# Patient Record
Sex: Female | Born: 1985 | Race: Black or African American | Hispanic: No | Marital: Single | Smoking: Never smoker
Health system: Southern US, Community
[De-identification: ages and names within clinical notes are randomized; demographics above are authoritative.]

## PROBLEM LIST (undated history)

## (undated) ENCOUNTER — Inpatient Hospital Stay (HOSPITAL_COMMUNITY): Payer: Self-pay

## (undated) DIAGNOSIS — O99345 Other mental disorders complicating the puerperium: Secondary | ICD-10-CM

## (undated) DIAGNOSIS — F53 Postpartum depression: Secondary | ICD-10-CM

## (undated) DIAGNOSIS — A749 Chlamydial infection, unspecified: Secondary | ICD-10-CM

## (undated) DIAGNOSIS — N739 Female pelvic inflammatory disease, unspecified: Secondary | ICD-10-CM

## (undated) HISTORY — PX: INDUCED ABORTION: SHX677

---
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
## 2007-04-04 ENCOUNTER — Inpatient Hospital Stay (HOSPITAL_COMMUNITY): Admission: AD | Admit: 2007-04-04 | Discharge: 2007-04-04 | Payer: Self-pay | Admitting: Obstetrics

## 2007-04-08 ENCOUNTER — Inpatient Hospital Stay (HOSPITAL_COMMUNITY): Admission: AD | Admit: 2007-04-08 | Discharge: 2007-04-08 | Payer: Self-pay | Admitting: Obstetrics & Gynecology

## 2007-04-14 ENCOUNTER — Inpatient Hospital Stay (HOSPITAL_COMMUNITY): Admission: AD | Admit: 2007-04-14 | Discharge: 2007-04-14 | Payer: Self-pay | Admitting: Obstetrics

## 2007-04-17 ENCOUNTER — Ambulatory Visit (HOSPITAL_COMMUNITY): Admission: RE | Admit: 2007-04-17 | Discharge: 2007-04-17 | Payer: Self-pay | Admitting: Obstetrics & Gynecology

## 2007-06-30 ENCOUNTER — Inpatient Hospital Stay (HOSPITAL_COMMUNITY): Admission: AD | Admit: 2007-06-30 | Discharge: 2007-06-30 | Payer: Self-pay | Admitting: Obstetrics & Gynecology

## 2007-08-02 ENCOUNTER — Ambulatory Visit: Admission: AD | Admit: 2007-08-02 | Discharge: 2007-08-02 | Payer: Self-pay | Admitting: Obstetrics & Gynecology

## 2007-08-03 ENCOUNTER — Inpatient Hospital Stay (HOSPITAL_COMMUNITY): Admission: AD | Admit: 2007-08-03 | Discharge: 2007-08-05 | Payer: Self-pay | Admitting: Obstetrics & Gynecology

## 2007-08-09 ENCOUNTER — Inpatient Hospital Stay (HOSPITAL_COMMUNITY): Admission: AD | Admit: 2007-08-09 | Discharge: 2007-08-09 | Payer: Self-pay | Admitting: Obstetrics

## 2008-01-02 ENCOUNTER — Inpatient Hospital Stay (HOSPITAL_COMMUNITY): Admission: AD | Admit: 2008-01-02 | Discharge: 2008-01-02 | Payer: Self-pay | Admitting: Obstetrics & Gynecology

## 2008-01-31 ENCOUNTER — Ambulatory Visit (HOSPITAL_COMMUNITY): Admission: RE | Admit: 2008-01-31 | Discharge: 2008-01-31 | Payer: Self-pay | Admitting: Obstetrics & Gynecology

## 2008-05-13 ENCOUNTER — Inpatient Hospital Stay (HOSPITAL_COMMUNITY): Admission: AD | Admit: 2008-05-13 | Discharge: 2008-05-13 | Payer: Self-pay | Admitting: Obstetrics

## 2008-05-31 ENCOUNTER — Inpatient Hospital Stay (HOSPITAL_COMMUNITY): Admission: AD | Admit: 2008-05-31 | Discharge: 2008-05-31 | Payer: Self-pay | Admitting: Obstetrics & Gynecology

## 2008-06-06 ENCOUNTER — Inpatient Hospital Stay (HOSPITAL_COMMUNITY): Admission: AD | Admit: 2008-06-06 | Discharge: 2008-06-06 | Payer: Self-pay | Admitting: Obstetrics

## 2008-06-08 ENCOUNTER — Inpatient Hospital Stay (HOSPITAL_COMMUNITY): Admission: AD | Admit: 2008-06-08 | Discharge: 2008-06-10 | Payer: Self-pay | Admitting: Obstetrics & Gynecology

## 2008-09-24 IMAGING — CT CT ANGIO CHEST
3 of 4 series · 19 of 31 positions shown · IV contrast (150ml omni/300%)
Comparison: none

CLINICAL DATA: 6 days post-partum from vaginal delivery.  Chest pain and shortness of breath.  Clinical suspicion for pulmonary embolism.      
CT ANGIOGRAPHY OF CHEST:
TECHNIQUE: Multidetector CT imaging of the chest was performed during bolus injection of intravenous contrast.  Multiplanar CT angiographic image reconstructions were generated to evaluate the vascular anatomy.
Contrast:  150 cc Omnipaque 300

[Series 4: recon 3: pe chest · axial · 0.66mm/px · z∈[-311,-106]mm · 12 of 251 slices shown]
[im 23/251  lung]
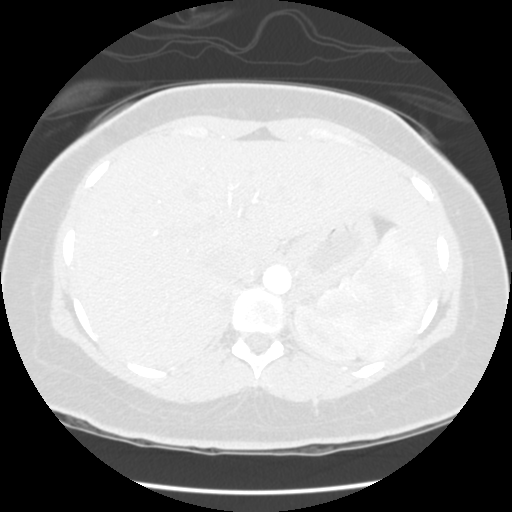
[im 46/251  mediastinal]
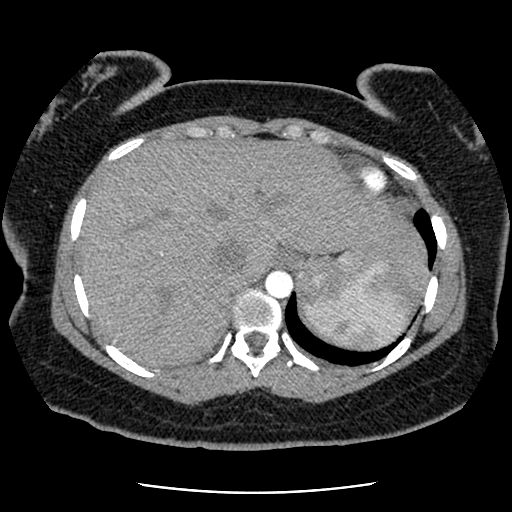
[im 69/251  lung]
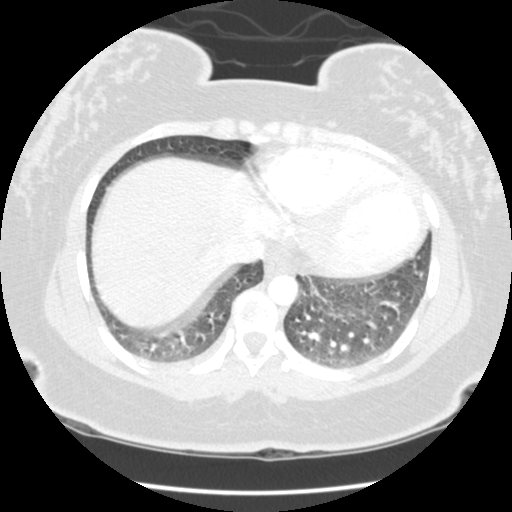
[im 91/251  mediastinal]
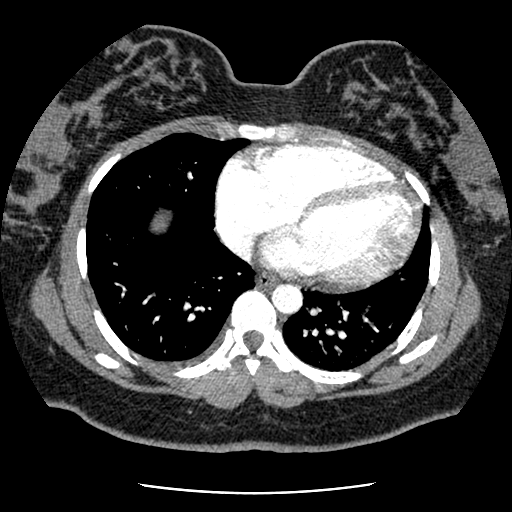
[im 114/251  lung]
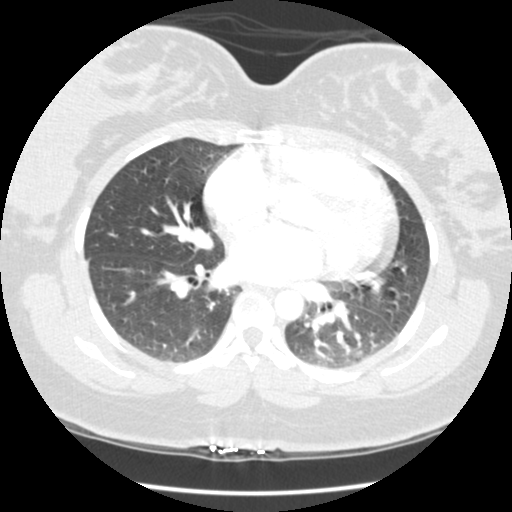
[im 126/251  mediastinal]
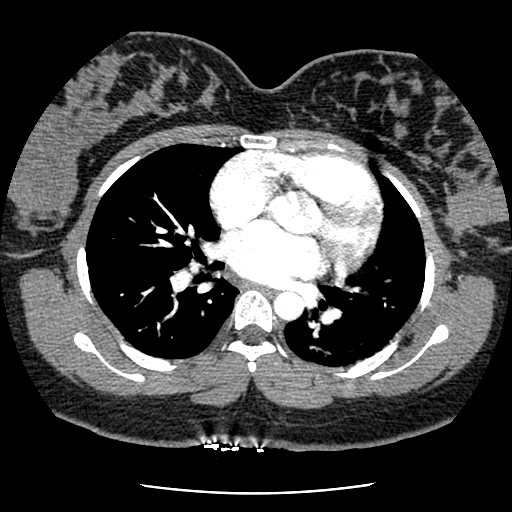
[im 137/251  lung]
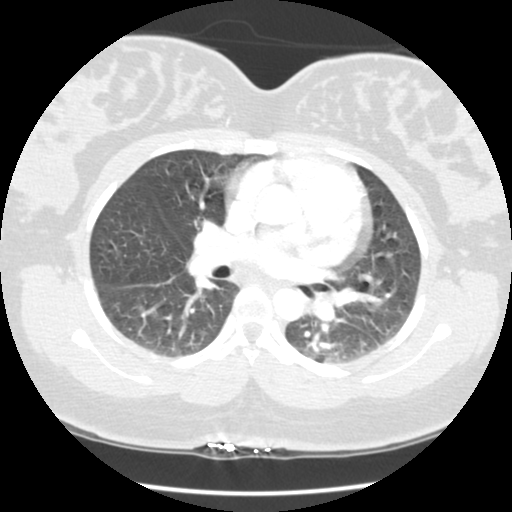
[im 153/251  mediastinal]
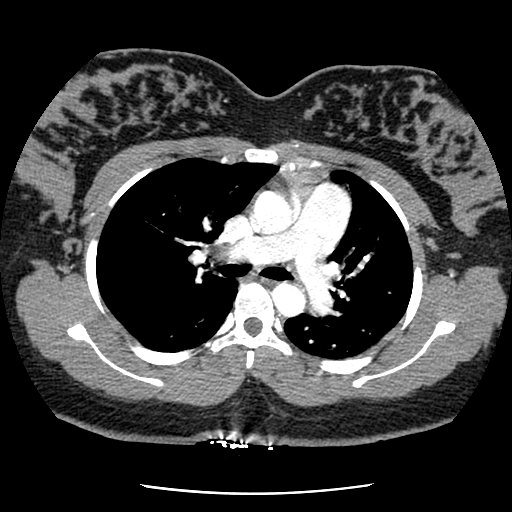
[im 160/251  lung]
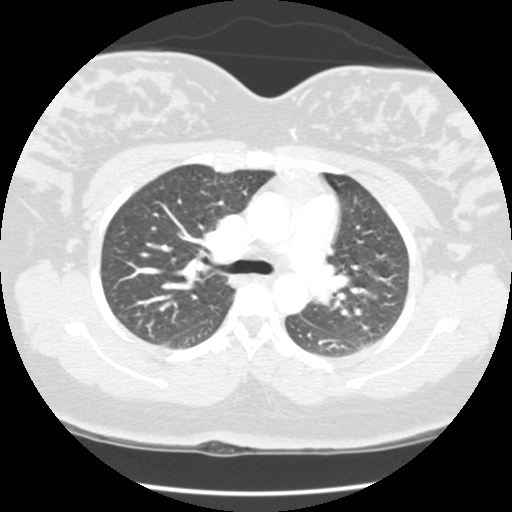
[im 182/251  mediastinal]
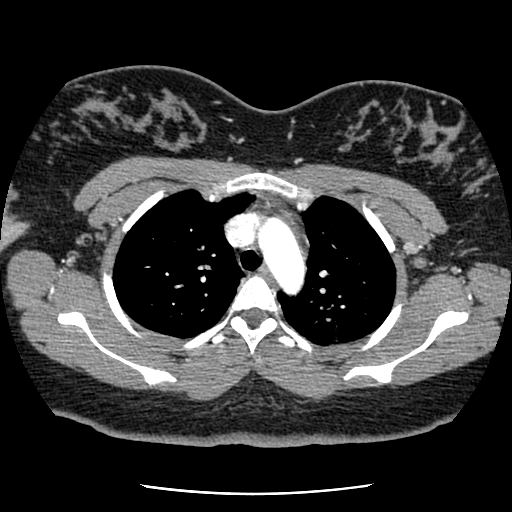
[im 205/251  lung]
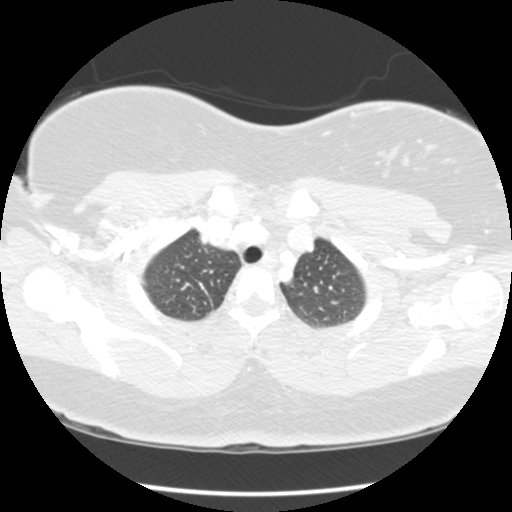
[im 228/251  mediastinal]
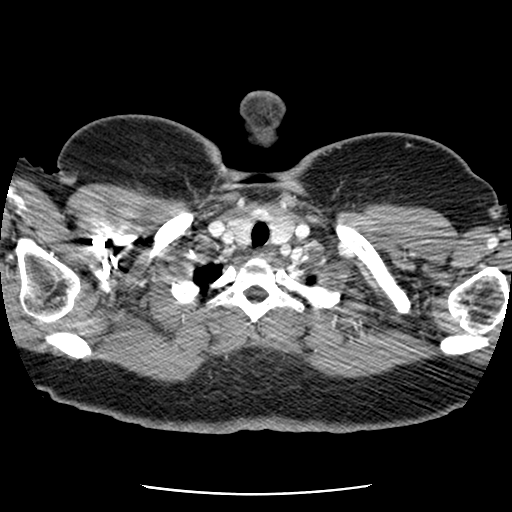

[Series 400: reformatted · coronal · 0.66mm/px · 3 of 96 slices shown (1 of 2)]
[im 24/96  lung]
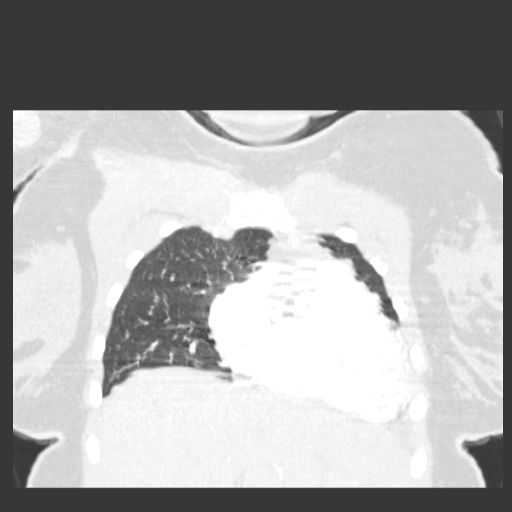
[im 48/96  lung]
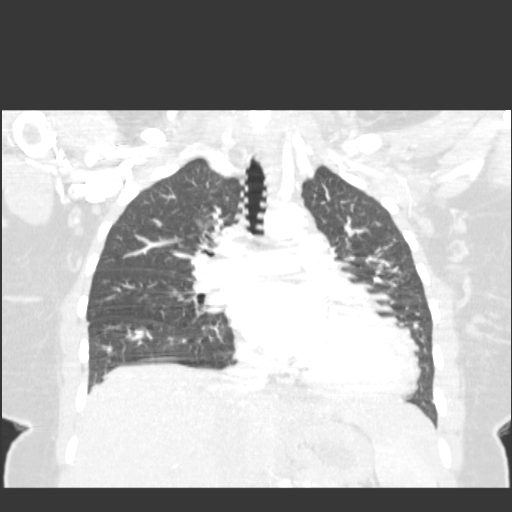
[im 72/96  lung]
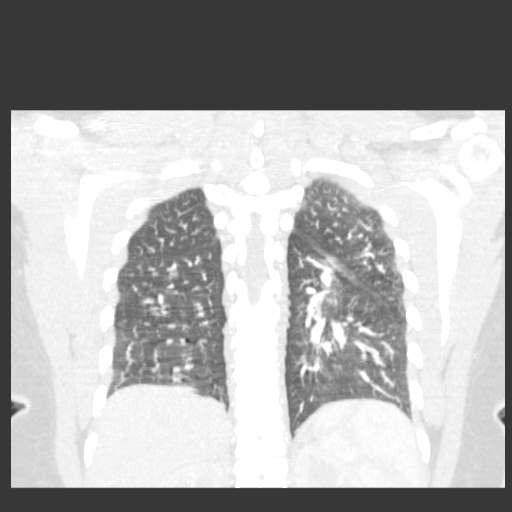

[Series 401: reformatted · sagittal · 0.66mm/px · 4 of 122 slices shown (2 of 2)]
[im 25/122  lung]
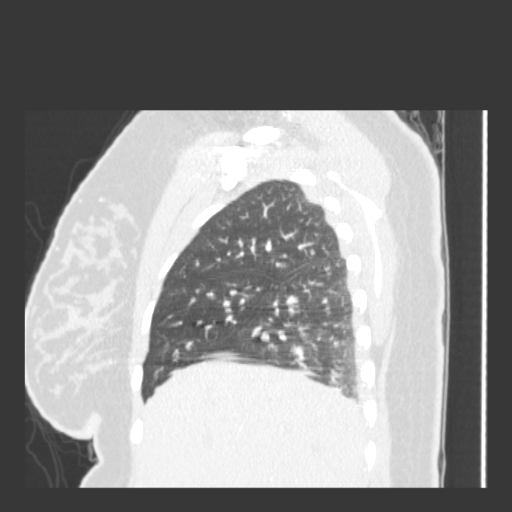
[im 49/122  lung]
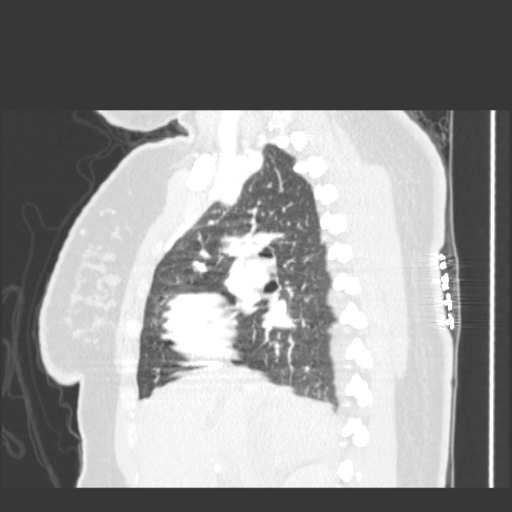
[im 73/122  lung]
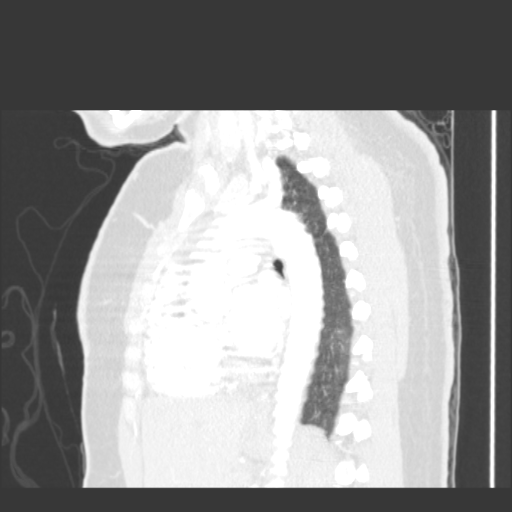
[im 97/122  lung]
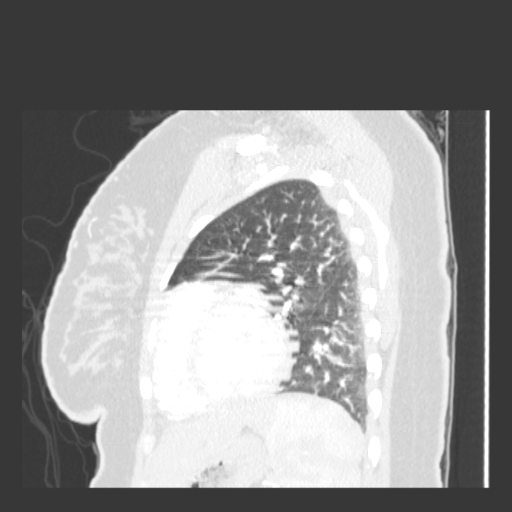

[19 of 31 positions shown; findings below may reference images not displayed]

FINDINGS: Satisfactory opacification of the pulmonary arteries is seen, and there is no evidence of pulmonary embolism.  There is no evidence of thoracic aortic aneurysm or dissection.  There is no evidence of hilar or mediastinal masses.  
There is no evidence of pulmonary consolidation or mass.  Mild thickening of pulmonary interlobular septa is seen mainly in the lung bases which is suspicious for mild interstitial edema.
IMPRESSION: 1.  No evidence of pulmonary embolism. 
2.  Mild interstitial prominence in the lung bases, suspicious for mild interstitial edema.

## 2009-03-18 ENCOUNTER — Emergency Department (HOSPITAL_COMMUNITY): Admission: EM | Admit: 2009-03-18 | Discharge: 2009-03-18 | Payer: Self-pay | Admitting: Emergency Medicine

## 2009-09-30 ENCOUNTER — Inpatient Hospital Stay (HOSPITAL_COMMUNITY): Admission: AD | Admit: 2009-09-30 | Discharge: 2009-09-30 | Payer: Self-pay | Admitting: Obstetrics & Gynecology

## 2009-10-25 ENCOUNTER — Emergency Department (HOSPITAL_COMMUNITY): Admission: EM | Admit: 2009-10-25 | Discharge: 2009-10-25 | Payer: Self-pay | Admitting: Emergency Medicine

## 2010-02-05 ENCOUNTER — Inpatient Hospital Stay (HOSPITAL_COMMUNITY): Admission: AD | Admit: 2010-02-05 | Discharge: 2010-02-05 | Payer: Self-pay | Admitting: Obstetrics and Gynecology

## 2010-03-21 ENCOUNTER — Inpatient Hospital Stay (HOSPITAL_COMMUNITY): Admission: AD | Admit: 2010-03-21 | Discharge: 2010-03-21 | Payer: Self-pay | Admitting: Obstetrics and Gynecology

## 2010-03-22 ENCOUNTER — Ambulatory Visit (HOSPITAL_COMMUNITY): Admission: RE | Admit: 2010-03-22 | Discharge: 2010-03-22 | Payer: Self-pay | Admitting: Obstetrics and Gynecology

## 2010-03-23 ENCOUNTER — Inpatient Hospital Stay (HOSPITAL_COMMUNITY): Admission: AD | Admit: 2010-03-23 | Discharge: 2010-03-23 | Payer: Self-pay | Admitting: Obstetrics and Gynecology

## 2010-04-06 ENCOUNTER — Inpatient Hospital Stay (HOSPITAL_COMMUNITY): Admission: AD | Admit: 2010-04-06 | Discharge: 2010-04-08 | Payer: Self-pay | Admitting: Obstetrics and Gynecology

## 2010-11-08 ENCOUNTER — Encounter: Payer: Self-pay | Admitting: Obstetrics & Gynecology

## 2010-11-18 ENCOUNTER — Other Ambulatory Visit: Payer: Self-pay | Admitting: Family Medicine

## 2010-11-18 ENCOUNTER — Inpatient Hospital Stay (INDEPENDENT_AMBULATORY_CARE_PROVIDER_SITE_OTHER)
Admission: RE | Admit: 2010-11-18 | Discharge: 2010-11-18 | Disposition: A | Discharge status: Home / Self Care | Payer: Self-pay | Source: Ambulatory Visit | Attending: Family Medicine | Admitting: Family Medicine

## 2010-11-18 ENCOUNTER — Other Ambulatory Visit: Payer: Self-pay

## 2010-11-18 DIAGNOSIS — N760 Acute vaginitis: Secondary | ICD-10-CM

## 2010-11-18 LAB — WET PREP, GENITAL: Yeast Wet Prep HPF POC: NONE SEEN

## 2010-11-19 LAB — POCT URINALYSIS DIPSTICK
Nitrite: NEGATIVE
Urine Glucose, Fasting: NEGATIVE mg/dL
Urobilinogen, UA: 0.2 mg/dL (ref 0.0–1.0)

## 2010-11-19 LAB — GC/CHLAMYDIA PROBE AMP, GENITAL: Chlamydia, DNA Probe: NEGATIVE

## 2010-11-19 LAB — POCT PREGNANCY, URINE: Preg Test, Ur: NEGATIVE

## 2011-01-03 LAB — CBC
HCT: 36.6 % (ref 36.0–46.0)
Hemoglobin: 11.9 g/dL — ABNORMAL LOW (ref 12.0–15.0)
Hemoglobin: 12.5 g/dL (ref 12.0–15.0)
MCV: 92.1 fL (ref 78.0–100.0)
Platelets: 141 10*3/uL — ABNORMAL LOW (ref 150–400)
RBC: 3.68 MIL/uL — ABNORMAL LOW (ref 3.87–5.11)
RDW: 13.4 % (ref 11.5–15.5)
RDW: 13.7 % (ref 11.5–15.5)
WBC: 9.1 10*3/uL (ref 4.0–10.5)

## 2011-01-04 LAB — URINALYSIS, ROUTINE W REFLEX MICROSCOPIC
Leukocytes, UA: NEGATIVE
Nitrite: NEGATIVE
Protein, ur: 30 mg/dL — AB

## 2011-01-04 LAB — URINE MICROSCOPIC-ADD ON

## 2011-01-05 LAB — URINALYSIS, ROUTINE W REFLEX MICROSCOPIC
Bilirubin Urine: NEGATIVE
Glucose, UA: NEGATIVE mg/dL
Hgb urine dipstick: NEGATIVE
Nitrite: NEGATIVE
Specific Gravity, Urine: 1.01 (ref 1.005–1.030)
Urobilinogen, UA: 0.2 mg/dL (ref 0.0–1.0)
pH: 6.5 (ref 5.0–8.0)

## 2011-01-19 LAB — URINE MICROSCOPIC-ADD ON

## 2011-01-19 LAB — URINALYSIS, ROUTINE W REFLEX MICROSCOPIC
Glucose, UA: NEGATIVE mg/dL
Nitrite: NEGATIVE

## 2011-03-02 NOTE — H&P (Signed)
NAMEROSALY, LABARBERA              ACCOUNT NO.:  [id number]   MEDICAL RECORD NO.:  [id number]          PATIENT TYPE:  INP   LOCATION:  9145                          FACILITY:  [facility]   PHYSICIAN:  Roseanna Rainbow, M.D.DATE OF BIRTH:  01/25/1986   DATE OF ADMISSION:  08/03/2007  DATE OF DISCHARGE:                              HISTORY & PHYSICAL   CHIEF COMPLAINT:  The patient is a 25 year old gravida 1, para 0, with  an estimated date of confinement of August 21, 2007, with an  intrauterine pregnancy at 37+ weeks complaining of ruptured membranes.   HISTORY OF PRESENT ILLNESS:  The patient reports leaking fluid possibly  since yesterday.  On exam in the office, there was copious thin mucus  with Nitrazine and fern negative.  Subsequent AFI was 8, and the patient  was for repeat AFI today.  She presented to the maternity admission's  unit with gross rupture fern and Nitrazine positive.   ALLERGIES:  No known drug allergies.   MEDICATIONS:  Prenatal vitamins.   OBSTETRIC RISK FACTORS:  Adolescent.   PRENATAL SCREENINGS:  Urine culture and sensitivity:  No growth.  One-  hour GCT:  56.  Hepatitis B surface antigen:  Negative.  Hematocrit  36.1, hemoglobin 11.8.  HIV:  Nonreactive.  Platelet count 193,000,  blood type B positive, antibody screen negative, RPR nonreactive,  rubella immune, sickle cell negative, varicella immune.   PAST GYNECOLOGIC HISTORY:  There was a history of gonorrhea.   PAST MEDICAL HISTORY:  She denies.   PAST SURGICAL HISTORY:  No previous surgeries.   SOCIAL HISTORY:  She is a Consulting civil engineer, does not give any significant history  of alcohol usage, has no significant smoking history, denies illicit  drug use.   FAMILY HISTORY:  No major illnesses known.   PHYSICAL EXAMINATION:  VITAL SIGNS:  Stable, afebrile, fetal heart  tracing reassuring.  Tocodynamometer:  Irregular uterine contractions.  PELVIC:  Sterile vaginal exam per the RN:  The cervix is 3 cm  dilated,  50% effaced.  Again, there was pooling of fluid that was fern and  Nitrazine positive.   ASSESSMENT:  Primigravida at term, early labor, questionable prolonged  rupture of membranes.  Group B strep culture is pending.  Fetal heart  tracing consistent with fetal well being.   PLAN:  Admission: likely augmentation of labor: low-dose Pitocin per  protocol: penicillin GBS prophylaxis for risk factors.      Roseanna Rainbow, M.D.  Electronically Signed     LAJ/MEDQ  D:  08/03/2007  T:  08/04/2007  Job:  [id number]

## 2011-07-16 LAB — URINALYSIS, ROUTINE W REFLEX MICROSCOPIC
Hgb urine dipstick: NEGATIVE
Protein, ur: NEGATIVE
pH: 6.5

## 2011-07-28 LAB — CBC
Hemoglobin: 13.1
MCV: 90.1
MCV: 90.5
RBC: 3.71 — ABNORMAL LOW
RBC: 4.17
RDW: 12.4
RDW: 12.7
WBC: 8.4

## 2011-07-28 LAB — RPR: RPR Ser Ql: NONREACTIVE

## 2011-07-30 LAB — URINALYSIS, ROUTINE W REFLEX MICROSCOPIC
Bilirubin Urine: NEGATIVE
Glucose, UA: NEGATIVE
Protein, ur: NEGATIVE
pH: 6.5

## 2011-08-04 LAB — URINALYSIS, ROUTINE W REFLEX MICROSCOPIC
Bilirubin Urine: NEGATIVE
Hgb urine dipstick: NEGATIVE
Nitrite: NEGATIVE
Protein, ur: NEGATIVE
Specific Gravity, Urine: 1.025
pH: 6.5

## 2011-08-04 LAB — URINE MICROSCOPIC-ADD ON

## 2012-09-28 ENCOUNTER — Encounter (HOSPITAL_COMMUNITY): Payer: Self-pay

## 2012-09-28 ENCOUNTER — Emergency Department (HOSPITAL_COMMUNITY)
Admission: EM | Admit: 2012-09-28 | Discharge: 2012-09-28 | Disposition: A | Discharge status: Home / Self Care | Payer: Self-pay | Attending: Emergency Medicine | Admitting: Emergency Medicine

## 2012-09-28 DIAGNOSIS — B0089 Other herpesviral infection: Secondary | ICD-10-CM | POA: Insufficient documentation

## 2012-09-28 MED ORDER — ACYCLOVIR 400 MG PO TABS: 400.0000 mg | ORAL_TABLET | Freq: Three times a day (TID) | ORAL | Status: DC

## 2012-09-28 NOTE — ED Provider Notes (Signed)
History     CSN: [id number]  Arrival date & time 09/28/12  0703   First MD Initiated Contact with Patient 09/28/12 0725      Chief Complaint  Patient presents with  . Rash     HPI Pt was seen at 0730.   Per pt, c/o gradual onset and persistence of intermittent blistering rash to her left 5th finger for the past 2 months.  Pt states the blisters "break" then "go away and come back."  Denies fevers, no open wounds, no injury, no focal motor weakness, no tingling/numbness in extremity.    History reviewed. No pertinent past medical history.  History reviewed. No pertinent past surgical history.   History  Substance Use Topics  . Smoking status: Never Smoker   . Smokeless tobacco: Not on file  . Alcohol Use: No      Review of Systems ROS: Statement: All systems negative except as marked or noted in the HPI; Constitutional: Negative for fever and chills. ; ; Eyes: Negative for eye pain, redness and discharge. ; ; ENMT: Negative for ear pain, hoarseness, nasal congestion, sinus pressure and sore throat. ; ; Cardiovascular: Negative for chest pain, palpitations, diaphoresis, dyspnea and peripheral edema. ; ; Respiratory: Negative for cough, wheezing and stridor. ; ; Gastrointestinal: Negative for nausea, vomiting, diarrhea, abdominal pain, blood in stool, hematemesis, jaundice and rectal bleeding. . ; ; Genitourinary: Negative for dysuria, flank pain and hematuria. ; ; Musculoskeletal: Negative for back pain and neck pain. Negative for swelling and trauma.; ; Skin: +blisters, rash. Negative for pruritus, abrasions, bruising and skin lesion.; ; Neuro: Negative for headache, lightheadedness and neck stiffness. Negative for weakness, altered level of consciousness , altered mental status, extremity weakness, paresthesias, involuntary movement, seizure and syncope.       Allergies  Review of patient's allergies indicates no known allergies.  Home Medications     BP 125/72  Temp  98.1 F (36.7 C) (Oral)  Resp 18  SpO2 100%  LMP 09/18/2012  Physical Exam 0735: Physical examination:  Nursing notes reviewed; Vital signs and O2 SAT reviewed;  Constitutional: Well developed, Well nourished, Well hydrated, In no acute distress; Head:  Normocephalic, atraumatic; Eyes: EOMI, PERRL, No scleral icterus; ENMT: Mouth and pharynx normal, Mucous membranes moist; Neck: Supple, Full range of motion, No lymphadenopathy; Cardiovascular: Regular rate and rhythm; Respiratory: Breath sounds clear.  Speaking full sentences with ease, Normal respiratory effort/excursion; Chest: No deformity, Movement normal;; Extremities: Pulses normal, No deformity. +small grouped vesicles on erythematous base to left dorsal 5th finger.  No open wounds, no ecchymosis. No drainage. No edema.; Neuro: AA&Ox3, Major CN grossly intact.  Speech clear. No gross focal motor or sensory deficits in extremities.; Skin: Color normal, Warm, Dry.   ED Course  Procedures     MDM  MDM Reviewed: nursing note and vitals     0740:  Rash has been present on and off for the past 2 months, starts as "itching" then small clusters of blisters appear.  Will tx for presumed herpetic whitlow.  Dx d/w pt.  Questions answered.  Verb understanding, agreeable to d/c home with outpt f/u with Derm MD.         Laray Anger, DO 09/29/12 1322

## 2012-09-28 NOTE — ED Notes (Signed)
Pt with blistering rash on left pinky finger

## 2012-10-18 NOTE — L&D Delivery Note (Signed)
Delivery Note At 4:56 PM a viable female was delivered via Vaginal, Spontaneous Delivery (Presentation: Left Occiput Anterior).  APGAR: 9, 9; weight TBD.   Placenta status: Intact, Spontaneous.  Cord: 3 vessels with the following complications: None.  Anesthesia: Epidural  Episiotomy: None Lacerations: None Suture Repair: n/a Est. Blood Loss (mL):  Mom to postpartum.  Baby to nursery-stable.  Baby had a double nuchal cord around her neck, which was reduced by Dr. Reola Calkins successfully prior to delivery.  Jacquelin Hawking, MD 06/11/2013, 6:16 PM  I was present for the entire delivery of this liveborn female by Dr. Caleb Popp. I agree with his documentation as above. Double nuchal that was reduced on the perineum. No tears or lacerations. Minimal blood loss.   Diora Bellizzi, Redmond Baseman, MD

## 2012-11-13 ENCOUNTER — Inpatient Hospital Stay (HOSPITAL_COMMUNITY)
Admission: AD | Admit: 2012-11-13 | Discharge: 2012-11-13 | Disposition: A | Discharge status: Home / Self Care | Payer: Self-pay | Source: Ambulatory Visit | Attending: Obstetrics & Gynecology | Admitting: Obstetrics & Gynecology

## 2012-11-13 ENCOUNTER — Encounter (HOSPITAL_COMMUNITY): Payer: Self-pay

## 2012-11-13 DIAGNOSIS — O219 Vomiting of pregnancy, unspecified: Secondary | ICD-10-CM

## 2012-11-13 DIAGNOSIS — O210 Mild hyperemesis gravidarum: Secondary | ICD-10-CM | POA: Insufficient documentation

## 2012-11-13 LAB — URINE MICROSCOPIC-ADD ON

## 2012-11-13 LAB — POCT PREGNANCY, URINE: Preg Test, Ur: POSITIVE — AB

## 2012-11-13 LAB — URINALYSIS, ROUTINE W REFLEX MICROSCOPIC
Glucose, UA: NEGATIVE mg/dL
Ketones, ur: NEGATIVE mg/dL
Nitrite: NEGATIVE
Protein, ur: NEGATIVE mg/dL
Specific Gravity, Urine: >1.03 — ABNORMAL HIGH (ref 1.005–1.030)

## 2012-11-13 MED ORDER — PROMETHAZINE HCL 12.5 MG PO TABS: 12.5000 mg | ORAL_TABLET | Freq: Four times a day (QID) | ORAL | Status: DC | PRN

## 2012-11-13 MED ORDER — ONDANSETRON HCL 4 MG PO TABS: 4.0000 mg | ORAL_TABLET | Freq: Four times a day (QID) | ORAL | Status: DC

## 2012-11-13 NOTE — MAU Provider Note (Signed)
History     CSN: [id number]  Arrival date and time: 11/13/12 5784   First Provider Initiated Contact with Patient 11/13/12 [phone]      Chief Complaint  Patient presents with  . Possible Pregnancy  . Emesis   HPI Ms. Susan [name] is a 27 y.o. [phone] at [redacted]w[redacted]d who presents to MAU complaining of frequent N/V and possible pregnancy. Her LMP was 09/17/12. She has had frequent N/V for about 3 weeks. The last episode of vomiting was yesterday. She ate this morning and is not nauseous right now. The patient states that she has had occasional lower abdominal cramping off and on for 3 weeks but denies any pain now. The patient also states that she has a "funny taste in her mouth" which she also had when pregnant with her other children. She denies fever, vaginal bleeding, abnormal discharge or LOF. She denies UTI symptoms.   OB History    Grav Para Term Preterm Abortions TAB SAB Ect Mult Living   5 3 3  1 1    3       Past Medical History  Diagnosis Date  . No pertinent past medical history     Past Surgical History  Procedure Date  . Induced abortion     Family History  Problem Relation Age of Onset  . Hypertension Mother   . Lupus Sister   . Sickle cell trait Sister   . Sickle cell trait Brother   . Hypertension Maternal Grandmother   . Lupus Maternal Grandfather     History  Substance Use Topics  . Smoking status: Never Smoker   . Smokeless tobacco: Not on file  . Alcohol Use: No    Allergies: No Known Allergies  No prescriptions prior to admission    ROS All negative unless otherwise noted in HPI Physical Exam   Blood pressure 133/62, pulse 100, temperature 98.6 F (37 C), temperature source Oral, resp. rate 16, height 5\' 6"  (1.676 m), weight 197 lb 6.4 oz (89.54 kg), last menstrual period 09/17/2012, SpO2 100.00%.  Physical Exam  Constitutional: She is oriented to person, place, and time. She appears well-developed and well-nourished. No distress.  HENT:    Head: Normocephalic.  Cardiovascular: Normal rate, regular rhythm and normal heart sounds.   Respiratory: Effort normal and breath sounds normal. No respiratory distress.  GI: Soft. Bowel sounds are normal. She exhibits no distension and no mass. There is no tenderness. There is no rebound and no guarding.  Neurological: She is alert and oriented to person, place, and time.  Skin: Skin is warm and dry. No erythema.  Psychiatric: She has a normal mood and affect.   Results for orders placed during the hospital encounter of 11/13/12 (from the past 24 hour(s))  URINALYSIS, ROUTINE W REFLEX MICROSCOPIC     Status: Abnormal   Collection Time   11/13/12  8:15 AM      Component Value Range   Color, Urine YELLOW  YELLOW   APPearance CLEAR  CLEAR   Specific Gravity, Urine >1.030 (*) 1.005 - 1.030   pH 5.5  5.0 - 8.0   Glucose, UA NEGATIVE  NEGATIVE mg/dL   Hgb urine dipstick TRACE (*) NEGATIVE   Bilirubin Urine NEGATIVE  NEGATIVE   Ketones, ur NEGATIVE  NEGATIVE mg/dL   Protein, ur NEGATIVE  NEGATIVE mg/dL   Urobilinogen, UA 0.2  0.0 - 1.0 mg/dL   Nitrite NEGATIVE  NEGATIVE   Leukocytes, UA NEGATIVE  NEGATIVE  URINE MICROSCOPIC-ADD  ON     Status: Abnormal   Collection Time   11/13/12  8:15 AM      Component Value Range   Squamous Epithelial / LPF FEW (*) RARE   WBC, UA 0-2  <3 WBC/hpf   Bacteria, UA FEW (*) RARE   Urine-Other MUCOUS PRESENT    POCT PREGNANCY, URINE     Status: Abnormal   Collection Time   11/13/12  8:28 AM      Component Value Range   Preg Test, Ur POSITIVE (*) NEGATIVE    MAU Course  Procedures None  MDM UA and UPT performed UPT positive  Assessment and Plan  A: Nausea and vomiting in pregnancy  P: Discharge home Rx for Phenergan and Zofran sent to patient's pharmacy Encouraged patient to maintain adequate PO hydration Patient plans to start [facility] care at [facility] Pregnancy confirmation letter given Patient may return to MAU as needed or  if she develops bleeding, abdominal pain or increased N/V  Freddi Starr, PA-C 11/13/2012, 9:03 AM

## 2012-11-13 NOTE — MAU Provider Note (Signed)
Attestation of Attending Supervision of Advanced Practitioner (CNM/NP): Evaluation and management procedures were performed by the Advanced Practitioner under my supervision and collaboration. I have reviewed the Advanced Practitioner's note and chart, and I agree with the management and plan.  Nikkia Devoss H. 4:26 PM   

## 2012-11-13 NOTE — MAU Note (Signed)
Patient states she has not had a period since 12-1. Has been having nausea and vomiting for about 3 weeks. Denies any pain or bleeding.

## 2012-12-07 ENCOUNTER — Inpatient Hospital Stay (HOSPITAL_COMMUNITY)
Admission: AD | Admit: 2012-12-07 | Discharge: 2012-12-07 | Disposition: A | Discharge status: Home / Self Care | Payer: Medicaid Other | Source: Ambulatory Visit | Attending: Obstetrics & Gynecology | Admitting: Obstetrics & Gynecology

## 2012-12-07 ENCOUNTER — Encounter (HOSPITAL_COMMUNITY): Payer: Self-pay | Admitting: *Deleted

## 2012-12-07 DIAGNOSIS — O210 Mild hyperemesis gravidarum: Secondary | ICD-10-CM | POA: Insufficient documentation

## 2012-12-07 LAB — URINALYSIS, ROUTINE W REFLEX MICROSCOPIC
Bilirubin Urine: NEGATIVE
Hgb urine dipstick: NEGATIVE
Protein, ur: NEGATIVE mg/dL
Urobilinogen, UA: 0.2 mg/dL (ref 0.0–1.0)

## 2012-12-07 MED ORDER — ONDANSETRON 8 MG PO TBDP
8.0000 mg | ORAL_TABLET | Freq: Once | ORAL | Status: AC
  Administered 2012-12-07: 8 mg via ORAL
  Filled 2012-12-07: qty 1

## 2012-12-07 MED ORDER — ONDANSETRON HCL 8 MG PO TABS: 8.0000 mg | ORAL_TABLET | Freq: Three times a day (TID) | ORAL | Status: DC | PRN

## 2012-12-07 MED ORDER — METOCLOPRAMIDE HCL 10 MG PO TABS
10.0000 mg | ORAL_TABLET | Freq: Once | ORAL | Status: AC
  Administered 2012-12-07: 10 mg via ORAL
  Filled 2012-12-07: qty 1

## 2012-12-07 NOTE — MAU Note (Signed)
Pt states hyperemesis with pregnancy, was given rx's for promethazine and zofran, however was only given 12 tablets of zofran. States phenergan doesn't work. Last vomited around 12 this afternoon. Denies abnormal vaginal discharge or bleeding.

## 2012-12-07 NOTE — MAU Provider Note (Signed)
History     CSN: [id number]  Arrival date and time: 12/07/12 1340   First Provider Initiated Contact with Patient 12/07/12 1600      Chief Complaint  Patient presents with  . Hyperemesis Gravidarum   HPI This is a 27 y.o. female at [redacted]w[redacted]d who presents with c/o nausea and vomiting. Has Phenergan at home but states does not work. Ran out of zofran.  States vomits all day and all night. States is unable to keep anything down.   RN Note:  Pt states hyperemesis with pregnancy, was given rx's for promethazine and zofran, however was only given 12 tablets of zofran. States phenergan doesn't work. Last vomited around 12 this afternoon. Denies abnormal vaginal discharge or bleeding.        OB History   Grav Para Term Preterm Abortions TAB SAB Ect Mult Living   5 3 3  1 1    3       Past Medical History  Diagnosis Date  . No pertinent past medical history     Past Surgical History  Procedure Laterality Date  . Induced abortion      Family History  Problem Relation Age of Onset  . Hypertension Mother   . Lupus Sister   . Sickle cell trait Sister   . Sickle cell trait Brother   . Hypertension Maternal Grandmother   . Lupus Maternal Grandfather     History  Substance Use Topics  . Smoking status: Never Smoker   . Smokeless tobacco: Not on file  . Alcohol Use: No    Allergies: No Known Allergies  Prescriptions prior to admission  Medication Sig Dispense Refill  . ondansetron (ZOFRAN) 4 MG tablet Take 1 tablet (4 mg total) by mouth every 6 (six) hours.  12 tablet  0  . promethazine (PHENERGAN) 12.5 MG tablet Take 1 tablet (12.5 mg total) by mouth every 6 (six) hours as needed for nausea.  30 tablet  0    Review of Systems  Constitutional: Negative for fever and malaise/fatigue.  Gastrointestinal: Positive for nausea and vomiting. Negative for abdominal pain, diarrhea and constipation.  Genitourinary: Negative for dysuria.   Physical Exam   Blood pressure 134/74,  pulse 93, temperature 98 F (36.7 C), temperature source Oral, resp. rate 18, height 5\' 4"  (1.626 m), weight 203 lb 2 oz (92.137 kg), last menstrual period 09/17/2012.  Physical Exam  Constitutional: She is oriented to person, place, and time. She appears well-developed and well-nourished. No distress.  Cardiovascular: Normal rate.   Respiratory: Effort normal.  GI: Soft. She exhibits no distension. There is no tenderness.  Musculoskeletal: Normal range of motion.  Neurological: She is alert and oriented to person, place, and time.  Skin: Skin is warm and dry.  Psychiatric: She has a normal mood and affect.   Results for orders placed during the hospital encounter of 12/07/12 (from the past 24 hour(s))  URINALYSIS, ROUTINE W REFLEX MICROSCOPIC     Status: Abnormal   Collection Time    12/07/12  1:55 PM      Result Value Range   Color, Urine YELLOW  YELLOW   APPearance CLOUDY (*) CLEAR   Specific Gravity, Urine >1.030 (*) 1.005 - 1.030   pH 6.0  5.0 - 8.0   Glucose, UA NEGATIVE  NEGATIVE mg/dL   Hgb urine dipstick NEGATIVE  NEGATIVE   Bilirubin Urine NEGATIVE  NEGATIVE   Ketones, ur NEGATIVE  NEGATIVE mg/dL   Protein, ur NEGATIVE  NEGATIVE mg/dL  Urobilinogen, UA 0.2  0.0 - 1.0 mg/dL   Nitrite NEGATIVE  NEGATIVE   Leukocytes, UA NEGATIVE  NEGATIVE  POCT PREGNANCY, URINE     Status: Abnormal   Collection Time    12/07/12  2:16 PM      Result Value Range   Preg Test, Ur POSITIVE (*) NEGATIVE    MAU Course  Procedures  MDM Given Reglan and Zofran. Able to keep Ginger Ale down. Talking about going to [facility].   Assessment and Plan  A:  SIUP at [redacted]w[redacted]d       Nausea and vomiting of pregnancy, ran out of meds      No ketones in urine  P:  Discharge home      BRAT diet      New Rx for Zofran sent to pharmacy.  Wynelle Bourgeois 12/07/2012, 4:07 PM

## 2012-12-25 ENCOUNTER — Inpatient Hospital Stay (HOSPITAL_COMMUNITY)
Admission: AD | Admit: 2012-12-25 | Discharge: 2012-12-25 | Disposition: A | Discharge status: Home / Self Care | Payer: Medicaid Other | Source: Ambulatory Visit | Attending: Obstetrics & Gynecology | Admitting: Obstetrics & Gynecology

## 2012-12-25 ENCOUNTER — Encounter (HOSPITAL_COMMUNITY): Payer: Self-pay

## 2012-12-25 DIAGNOSIS — O219 Vomiting of pregnancy, unspecified: Secondary | ICD-10-CM

## 2012-12-25 DIAGNOSIS — O210 Mild hyperemesis gravidarum: Secondary | ICD-10-CM | POA: Insufficient documentation

## 2012-12-25 DIAGNOSIS — R109 Unspecified abdominal pain: Secondary | ICD-10-CM | POA: Insufficient documentation

## 2012-12-25 LAB — URINALYSIS, ROUTINE W REFLEX MICROSCOPIC
Glucose, UA: NEGATIVE mg/dL
Ketones, ur: NEGATIVE mg/dL
Protein, ur: 30 mg/dL — AB

## 2012-12-25 LAB — URINE MICROSCOPIC-ADD ON

## 2012-12-25 MED ORDER — PRENATAL VITAMINS 28-0.8 MG PO TABS: 1.0000 | ORAL_TABLET | Freq: Every day | ORAL | Status: DC

## 2012-12-25 NOTE — MAU Provider Note (Signed)
History     CSN: [id number]  Arrival date and time: 12/25/12 1028   First Provider Initiated Contact with Patient 12/25/12 1050      Chief Complaint  Patient presents with  . Abdominal Cramping   HPI Ms. Culmer is a 27 y/o female [phone] @[redacted]w[redacted]d  who presents with complaint of emesis and abdominal cramping that occurred 2 days ago. She began experiencing nausea and vomiting on 12/22/12 that persisted through 12/23/12. She reports 13 episodes of vomiting on 3/8. She describes the emesis as initially consisting of food, but progressing to a bilious consistency. She notes that she has had several similar episodes during this pregnancy, but that she did not previously have associated abdominal cramping. The cramping was noticed on her sides after initial onset of vomiting. She did not take any anti-emetic medication, but notes that previously she has had improvement with PO phenergan and Zofran. She denies any additional nausea/vomitng since 3/8, and reports good PO intake of solids and liquids. She had some chills on 3/8, but denies any fever. No vaginal bleeding or discharge, dysuria, hematuria, flank pain, diarrhea or constipation. She has not had formal pre-natal care pending insurance coverage.  OB History   Grav Para Term Preterm Abortions TAB SAB Ect Mult Living   5 3 3  1 1    3       Past Medical History  Diagnosis Date  . No pertinent past medical history     Past Surgical History  Procedure Laterality Date  . Induced abortion      Family History  Problem Relation Age of Onset  . Hypertension Mother   . Lupus Sister   . Sickle cell trait Sister   . Sickle cell trait Brother   . Hypertension Maternal Grandmother   . Lupus Maternal Grandfather     History  Substance Use Topics  . Smoking status: Never Smoker   . Smokeless tobacco: Not on file  . Alcohol Use: No    Allergies: No Known Allergies  Prescriptions prior to admission  Medication Sig Dispense Refill  .  ondansetron (ZOFRAN) 4 MG tablet Take 1 tablet (4 mg total) by mouth every 6 (six) hours.  12 tablet  0  . ondansetron (ZOFRAN) 8 MG tablet Take 1 tablet (8 mg total) by mouth every 8 (eight) hours as needed for nausea.  20 tablet  0  . promethazine (PHENERGAN) 12.5 MG tablet Take 1 tablet (12.5 mg total) by mouth every 6 (six) hours as needed for nausea.  30 tablet  0    Review of Systems  Constitutional: Positive for chills (Saturday night). Negative for fever and diaphoresis.  Respiratory: Negative for cough.   Cardiovascular: Negative for chest pain and palpitations.  Gastrointestinal: Positive for nausea, vomiting and abdominal pain. Negative for diarrhea and constipation.  Genitourinary: Negative for dysuria, urgency, frequency, hematuria and flank pain.   Physical Exam   Blood pressure 131/70, pulse 88, temperature 98.3 F (36.8 C), temperature source Oral, resp. rate 18, height 5\' 5"  (1.651 m), weight 91.536 kg (201 lb 12.8 oz), last menstrual period 09/17/2012.  Physical Exam  Vitals reviewed. Constitutional: She appears well-developed and well-nourished.  HENT:  Head: Normocephalic and atraumatic.  Cardiovascular: Normal rate and regular rhythm.   Respiratory: Effort normal and breath sounds normal.  GI: Soft. Bowel sounds are normal. There is no tenderness.  Skin: Skin is warm and dry.   Results for orders placed during the hospital encounter of 12/25/12 (from the past 24  hour(s))  URINALYSIS, ROUTINE W REFLEX MICROSCOPIC     Status: Abnormal   Collection Time    12/25/12 10:30 AM      Result Value Range   Color, Urine YELLOW  YELLOW   APPearance HAZY (*) CLEAR   Specific Gravity, Urine 1.025  1.005 - 1.030   pH 6.0  5.0 - 8.0   Glucose, UA NEGATIVE  NEGATIVE mg/dL   Hgb urine dipstick TRACE (*) NEGATIVE   Bilirubin Urine NEGATIVE  NEGATIVE   Ketones, ur NEGATIVE  NEGATIVE mg/dL   Protein, ur 30 (*) NEGATIVE mg/dL   Urobilinogen, UA 2.0 (*) 0.0 - 1.0 mg/dL    Nitrite NEGATIVE  NEGATIVE   Leukocytes, UA SMALL (*) NEGATIVE  URINE MICROSCOPIC-ADD ON     Status: Abnormal   Collection Time    12/25/12 10:30 AM      Result Value Range   Squamous Epithelial / LPF FEW (*) RARE   WBC, UA 3-6  <3 WBC/hpf   Bacteria, UA MANY (*) RARE   Urine-Other MUCOUS PRESENT     FHR 150s by doppler  MAU Course  Procedures    Assessment and Plan   1. Nausea/vomiting in pregnancy     D/C home Reassurance provided about normal FHR Encouraged increased PO fluids Continue Zofran and Phenergan as prescribed F/U with [facility] as planned Return to MAU as needed   LEFTWICH-KIRBY, LISA 12/25/2012, 10:59 AM

## 2012-12-25 NOTE — MAU Note (Signed)
Vomiting on Saturday couldn't keep anything down none since worried about the baby has not started prenatal care, having some cramping

## 2012-12-25 NOTE — MAU Note (Signed)
Patient states she has had no prenatal care. Plans to go to [facility]. States she has had some vomiting but none since 3-8. Has some cramping on and off, no bleeding.

## 2012-12-26 LAB — URINE CULTURE

## 2013-02-03 ENCOUNTER — Inpatient Hospital Stay (HOSPITAL_COMMUNITY)
Admission: AD | Admit: 2013-02-03 | Discharge: 2013-02-03 | Disposition: A | Discharge status: Home / Self Care | Payer: Medicaid Other | Source: Ambulatory Visit | Attending: Obstetrics & Gynecology | Admitting: Obstetrics & Gynecology

## 2013-02-03 ENCOUNTER — Encounter (HOSPITAL_COMMUNITY): Payer: Self-pay | Admitting: Obstetrics and Gynecology

## 2013-02-03 DIAGNOSIS — IMO0002 Reserved for concepts with insufficient information to code with codable children: Secondary | ICD-10-CM | POA: Insufficient documentation

## 2013-02-03 DIAGNOSIS — O99891 Other specified diseases and conditions complicating pregnancy: Secondary | ICD-10-CM | POA: Insufficient documentation

## 2013-02-03 DIAGNOSIS — E860 Dehydration: Secondary | ICD-10-CM | POA: Insufficient documentation

## 2013-02-03 DIAGNOSIS — Y92009 Unspecified place in unspecified non-institutional (private) residence as the place of occurrence of the external cause: Secondary | ICD-10-CM | POA: Insufficient documentation

## 2013-02-03 DIAGNOSIS — R109 Unspecified abdominal pain: Secondary | ICD-10-CM | POA: Insufficient documentation

## 2013-02-03 DIAGNOSIS — O26899 Other specified pregnancy related conditions, unspecified trimester: Secondary | ICD-10-CM

## 2013-02-03 LAB — URINALYSIS, ROUTINE W REFLEX MICROSCOPIC
Glucose, UA: 250 mg/dL — AB
Hgb urine dipstick: NEGATIVE
Ketones, ur: 15 mg/dL — AB
Protein, ur: NEGATIVE mg/dL

## 2013-02-03 LAB — WET PREP, GENITAL: Trich, Wet Prep: NONE SEEN

## 2013-02-03 NOTE — MAU Provider Note (Signed)
History     CSN: [id number]  Arrival date and time: 02/03/13 1706   First Provider Initiated Contact with Patient 02/03/13 1745      Chief Complaint  Patient presents with  . Abdominal Pain   HPI Susan [name] 27 y.o. [redacted]w[redacted]d  Comes to MAU today with lower abdominal cramping.  Has applied for Medicaid and has a letter with her Medicaid number but no Medicaid card. ([employer] having difficulty with interface and unable to print her card).  Is very worried and wants to start prenatal care ASAP.  Had called the clinic downstairs and was told the first available appointment was the end of May.    OB History   Grav Para Term Preterm Abortions TAB SAB Ect Mult Living   5 3 3  1 1    3       Past Medical History  Diagnosis Date  . No pertinent past medical history     Past Surgical History  Procedure Laterality Date  . Induced abortion      Family History  Problem Relation Age of Onset  . Hypertension Mother   . Lupus Sister   . Sickle cell trait Sister   . Sickle cell trait Brother   . Hypertension Maternal Grandmother   . Lupus Maternal Grandfather     History  Substance Use Topics  . Smoking status: Never Smoker   . Smokeless tobacco: Not on file  . Alcohol Use: No    Allergies: No Known Allergies  No prescriptions prior to admission    Review of Systems  Constitutional: Negative for fever.  Gastrointestinal: Positive for abdominal pain. Negative for nausea, vomiting and diarrhea.  Genitourinary:       No vaginal discharge. No vaginal bleeding. No dysuria.   Physical Exam   Blood pressure 134/56, pulse 103, temperature 98.3 F (36.8 C), temperature source Oral, resp. rate 18, height 5\' 4"  (1.626 m), weight 217 lb 3.2 oz (98.521 kg), last menstrual period 09/17/2012.  Physical Exam  Nursing note and vitals reviewed. Constitutional: She is oriented to person, place, and time. She appears well-developed and well-nourished.  HENT:  Head: Normocephalic.   Eyes: EOM are normal.  Neck: Neck supple.  GI: Soft. There is tenderness. There is no rebound and no guarding.  No contractions palpated.  FHT 146 with doppler.  Fundus just below the umbilicus.  Genitourinary:  Speculum exam: Vagina - Small amount of creamy discharge, no odor Cervix - No contact bleeding Bimanual exam: Cervix closed and thick Uterus non tender, S=D,  Adnexa Nontender, no masses bilaterally GC/Chlam, wet prep done Chaperone present for exam.  Musculoskeletal: Normal range of motion.  Neurological: She is alert and oriented to person, place, and time.  Skin: Skin is warm and dry.  Psychiatric: She has a normal mood and affect.    MAU Course  Procedures Results for orders placed during the hospital encounter of 02/03/13 (from the past 24 hour(s))  URINALYSIS, ROUTINE W REFLEX MICROSCOPIC     Status: Abnormal   Collection Time    02/03/13  5:15 PM      Result Value Range   Color, Urine YELLOW  YELLOW   APPearance CLEAR  CLEAR   Specific Gravity, Urine 1.025  1.005 - 1.030   pH 6.0  5.0 - 8.0   Glucose, UA 250 (*) NEGATIVE mg/dL   Hgb urine dipstick NEGATIVE  NEGATIVE   Bilirubin Urine NEGATIVE  NEGATIVE   Ketones, ur 15 (*) NEGATIVE mg/dL  Protein, ur NEGATIVE  NEGATIVE mg/dL   Urobilinogen, UA 0.2  0.0 - 1.0 mg/dL   Nitrite NEGATIVE  NEGATIVE   Leukocytes, UA NEGATIVE  NEGATIVE  WET PREP, GENITAL     Status: Abnormal   Collection Time    02/03/13  5:50 PM      Result Value Range   Yeast Wet Prep HPF POC NONE SEEN  NONE SEEN   Trich, Wet Prep NONE SEEN  NONE SEEN   Clue Cells Wet Prep HPF POC RARE (*) NONE SEEN   WBC, Wet Prep HPF POC MODERATE (*) NONE SEEN   MDM Message sent to clinic to call patient and get her in for labs only visit so she does not miss the window for quad screen  Assessment and Plan  Pregnancy [redacted] week gestation with abdominal pain Mild dehydration  Plan No smoking, no drugs, no alcohol.  Take a prenatal vitamin one by mouth  every day.  Eat small frequent snacks to avoid nausea.  Begin prenatal care as soon as possible. Take Tylenol 325 mg 2 tablets by mouth every 4 hours if needed for pain. Drink at least 8 8-oz glasses of water every day. Expect the clinic to call to and discuss scheduling you to begin prenatal care.  BURLESON,TERRI 02/03/2013, 6:49 PM
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
## 2013-02-05 LAB — GC/CHLAMYDIA PROBE AMP: CT Probe RNA: NEGATIVE

## 2013-02-13 ENCOUNTER — Other Ambulatory Visit: Payer: Self-pay

## 2013-02-13 DIAGNOSIS — O0932 Supervision of pregnancy with insufficient antenatal care, second trimester: Secondary | ICD-10-CM

## 2013-02-13 DIAGNOSIS — Z3201 Encounter for pregnancy test, result positive: Secondary | ICD-10-CM

## 2013-02-13 LAB — HIV ANTIBODY (ROUTINE TESTING W REFLEX): HIV: NONREACTIVE

## 2013-02-14 LAB — OBSTETRIC PANEL
Antibody Screen: NEGATIVE
Basophils Relative: 0 % (ref 0–1)
Eosinophils Relative: 1 % (ref 0–5)
Lymphocytes Relative: 20 % (ref 12–46)
MCHC: 36.1 g/dL — ABNORMAL HIGH (ref 30.0–36.0)
Monocytes Absolute: 0.7 10*3/uL (ref 0.1–1.0)
Monocytes Relative: 8 % (ref 3–12)
Neutro Abs: 5.8 10*3/uL (ref 1.7–7.7)
RBC: 3.88 MIL/uL (ref 3.87–5.11)
Rh Type: POSITIVE
Rubella: 0.69 Index (ref <0.90)
WBC: 8.3 10*3/uL (ref 4.0–10.5)

## 2013-02-15 ENCOUNTER — Ambulatory Visit (HOSPITAL_COMMUNITY)
Admission: RE | Admit: 2013-02-15 | Discharge: 2013-02-15 | Disposition: A | Discharge status: Home / Self Care | Payer: Medicaid Other | Source: Ambulatory Visit | Attending: Obstetrics & Gynecology | Admitting: Obstetrics & Gynecology

## 2013-02-15 DIAGNOSIS — Z3689 Encounter for other specified antenatal screening: Secondary | ICD-10-CM | POA: Insufficient documentation

## 2013-02-15 DIAGNOSIS — O0932 Supervision of pregnancy with insufficient antenatal care, second trimester: Secondary | ICD-10-CM

## 2013-02-21 ENCOUNTER — Other Ambulatory Visit: Payer: Self-pay | Admitting: Obstetrics and Gynecology

## 2013-02-21 ENCOUNTER — Ambulatory Visit (INDEPENDENT_AMBULATORY_CARE_PROVIDER_SITE_OTHER): Payer: Medicaid Other | Admitting: Obstetrics and Gynecology

## 2013-02-21 ENCOUNTER — Encounter: Payer: Self-pay | Admitting: Obstetrics and Gynecology

## 2013-02-21 VITALS — BP 115/73 | Temp 97.5°F | Wt 221.7 lb

## 2013-02-21 DIAGNOSIS — Z789 Other specified health status: Secondary | ICD-10-CM | POA: Insufficient documentation

## 2013-02-21 DIAGNOSIS — E669 Obesity, unspecified: Secondary | ICD-10-CM

## 2013-02-21 DIAGNOSIS — IMO0002 Reserved for concepts with insufficient information to code with codable children: Secondary | ICD-10-CM

## 2013-02-21 DIAGNOSIS — O0930 Supervision of pregnancy with insufficient antenatal care, unspecified trimester: Secondary | ICD-10-CM

## 2013-02-21 DIAGNOSIS — R6889 Other general symptoms and signs: Secondary | ICD-10-CM

## 2013-02-21 DIAGNOSIS — Z3480 Encounter for supervision of other normal pregnancy, unspecified trimester: Secondary | ICD-10-CM | POA: Insufficient documentation

## 2013-02-21 DIAGNOSIS — Z3400 Encounter for supervision of normal first pregnancy, unspecified trimester: Secondary | ICD-10-CM

## 2013-02-21 DIAGNOSIS — Z3482 Encounter for supervision of other normal pregnancy, second trimester: Secondary | ICD-10-CM

## 2013-02-21 DIAGNOSIS — Z3402 Encounter for supervision of normal first pregnancy, second trimester: Secondary | ICD-10-CM | POA: Insufficient documentation

## 2013-02-21 LAB — POCT URINALYSIS DIP (DEVICE)
Bilirubin Urine: NEGATIVE
Glucose, UA: NEGATIVE mg/dL
Hgb urine dipstick: NEGATIVE
Specific Gravity, Urine: 1.02 (ref 1.005–1.030)

## 2013-02-21 NOTE — Progress Notes (Signed)
   Subjective:    Susan [name] is a [id number] [redacted]w[redacted]d being seen today for her first obstetrical visit. Accepting "getting used to idea" of pregnancy. Has supportive FOB and family. Works [employer].  Her obstetrical history is significant for none. Patient does intend to breast feed. Pregnancy history fully reviewed.  Patient reports no complaints.  Filed Vitals:   02/21/13 1008  BP: 115/73  Temp: 97.5 F (36.4 C)  Weight: 221 lb 11.2 oz (100.562 kg)    HISTORY: OB History   Grav Para Term Preterm Abortions TAB SAB Ect Mult Living   5 3 3  0 1 1 0 0 0 3     # Outc Date GA Lbr Len/2nd Wgt Sex Del Anes PTL Lv   1 TRM 10/08 [redacted]w[redacted]d  6lb(2.722kg) F SVD Spinal  Yes   2 TRM 8/09 [redacted]w[redacted]d  7lb11oz(3.487kg) M SVD None  Yes   3 TAB 2011           4 TRM 6/11 [redacted]w[redacted]d  6lb(2.722kg) M SVD Spinal  Yes   5 CUR              Past Medical History  Diagnosis Date  . No pertinent past medical history    Past Surgical History  Procedure Laterality Date  . Induced abortion     Family History  Problem Relation Age of Onset  . Hypertension Mother   . Lupus Sister   . Sickle cell trait Sister   . Sickle cell trait Brother   . Hypertension Maternal Grandmother   . Heart disease Maternal Grandmother   . Lupus Maternal Grandfather   . Diabetes Maternal Aunt   . Hypertension Maternal Aunt      Exam    Uterus:     Pelvic Exam:    Perineum: No Hemorrhoids, Normal Perineum   Vulva: normal, Bartholin's, Urethra, Skene's normal   Vagina:  normal mucosa, normal discharge       Cervix: no bleeding following Pap, no cervical motion tenderness and nulliparous appearance   Adnexa: not evaluated   Bony Pelvis: gynecoid  System: Breast:  normal appearance, no masses or tenderness   Skin: normal coloration and turgor, no rashes    Neurologic: oriented, normal, grossly non-focal   Extremities: normal strength, tone, and muscle mass   HEENT PERRLA   Mouth/Teeth mucous membranes moist, pharynx normal without  lesions and dental hygiene good   Neck supple and no masses   Cardiovascular: regular rate and rhythm, no murmurs or gallops   Respiratory:  appears well, vitals normal, no respiratory distress, acyanotic, normal RR, ear and throat exam is normal, neck free of mass or lymphadenopathy, chest clear, no wheezing, crepitations, rhonchi, normal symmetric air entry   Abdomen: NT, S=D   Urinary: urethral meatus normal      Assessment:    Pregnancy: [id number] Patient Active Problem List   Diagnosis Date Noted  . Encounter for supervision of normal first pregnancy in second trimester 02/21/2013  . Rubella non-immune status 02/21/2013  . Obesity 02/21/2013        Plan:     Initial labs and [place] reviewed.  Prenatal vitamins. Problem list reviewed and updated. Genetic Screening discussed Quad Screen: too late.  Ultrasound discussed; fetal survey: done.  Follow up in 4 weeks. 50% of 30 min visit spent on counseling and coordination of care.  Have [place] report revised (says low AFI <3rd but should say largest pocket not AFI)   Victorina Kable 02/21/2013

## 2013-02-21 NOTE — Progress Notes (Signed)
Pulse- 88 Patient reports all over body pain and occasional contractions

## 2013-02-21 NOTE — Patient Instructions (Signed)
Pregnancy - Second Trimester The second trimester of pregnancy (3 to 6 months) is a period of rapid growth for you and your baby. At the end of the sixth month, your baby is about 9 inches long and weighs 1 1/2 pounds. You will begin to feel the baby move between 18 and 20 weeks of the pregnancy. This is called quickening. Weight gain is faster. A clear fluid (colostrum) may leak out of your breasts. You may feel small contractions of the womb (uterus). This is known as false labor or Braxton-Hicks contractions. This is like a practice for labor when the baby is ready to be born. Usually, the problems with morning sickness have usually passed by the end of your first trimester. Some women develop small dark blotches (called cholasma, mask of pregnancy) on their face that usually goes away after the baby is born. Exposure to the sun makes the blotches worse. Acne may also develop in some pregnant women and pregnant women who have acne, may find that it goes away. PRENATAL EXAMS  Blood work may continue to be done during prenatal exams. These tests are done to check on your health and the probable health of your baby. Blood work is used to follow your blood levels (hemoglobin). Anemia (low hemoglobin) is common during pregnancy. Iron and vitamins are given to help prevent this. You will also be checked for diabetes between 24 and 28 weeks of the pregnancy. Some of the previous blood tests may be repeated.  The size of the uterus is measured during each visit. This is to make sure that the baby is continuing to grow properly according to the dates of the pregnancy.  Your blood pressure is checked every prenatal visit. This is to make sure you are not getting toxemia.  Your urine is checked to make sure you do not have an infection, diabetes or protein in the urine.  Your weight is checked often to make sure gains are happening at the suggested rate. This is to ensure that both you and your baby are growing  normally.  Sometimes, an ultrasound is performed to confirm the proper growth and development of the baby. This is a test which bounces harmless sound waves off the baby so your caregiver can more accurately determine due dates. Sometimes, a specialized test is done on the amniotic fluid surrounding the baby. This test is called an amniocentesis. The amniotic fluid is obtained by sticking a needle into the belly (abdomen). This is done to check the chromosomes in instances where there is a concern about possible genetic problems with the baby. It is also sometimes done near the end of pregnancy if an early delivery is required. In this case, it is done to help make sure the baby's lungs are mature enough for the baby to live outside of the womb. CHANGES OCCURING IN THE SECOND TRIMESTER OF PREGNANCY Your body goes through many changes during pregnancy. They vary from person to person. Talk to your caregiver about changes you notice that you are concerned about.  During the second trimester, you will likely have an increase in your appetite. It is normal to have cravings for certain foods. This varies from person to person and pregnancy to pregnancy.  Your lower abdomen will begin to bulge.  You may have to urinate more often because the uterus and baby are pressing on your bladder. It is also common to get more bladder infections during pregnancy (pain with urination). You can help this by   drinking lots of fluids and emptying your bladder before and after intercourse.  You may begin to get stretch marks on your hips, abdomen, and breasts. These are normal changes in the body during pregnancy. There are no exercises or medications to take that prevent this change.  You may begin to develop swollen and bulging veins (varicose veins) in your legs. Wearing support hose, elevating your feet for 15 minutes, 3 to 4 times a day and limiting salt in your diet helps lessen the problem.  Heartburn may develop  as the uterus grows and pushes up against the stomach. Antacids recommended by your caregiver helps with this problem. Also, eating smaller meals 4 to 5 times a day helps.  Constipation can be treated with a stool softener or adding bulk to your diet. Drinking lots of fluids, vegetables, fruits, and whole grains are helpful.  Exercising is also helpful. If you have been very active up until your pregnancy, most of these activities can be continued during your pregnancy. If you have been less active, it is helpful to start an exercise program such as walking.  Hemorrhoids (varicose veins in the rectum) may develop at the end of the second trimester. Warm sitz baths and hemorrhoid cream recommended by your caregiver helps hemorrhoid problems.  Backaches may develop during this time of your pregnancy. Avoid heavy lifting, wear low heal shoes and practice good posture to help with backache problems.  Some pregnant women develop tingling and numbness of their hand and fingers because of swelling and tightening of ligaments in the wrist (carpel tunnel syndrome). This goes away after the baby is born.  As your breasts enlarge, you may have to get a bigger bra. Get a comfortable, cotton, support bra. Do not get a nursing bra until the last month of the pregnancy if you will be nursing the baby.  You may get a dark line from your belly button to the pubic area called the linea nigra.  You may develop rosy cheeks because of increase blood flow to the face.  You may develop spider looking lines of the face, neck, arms and chest. These go away after the baby is born. HOME CARE INSTRUCTIONS   It is extremely important to avoid all smoking, herbs, alcohol, and unprescribed drugs during your pregnancy. These chemicals affect the formation and growth of the baby. Avoid these chemicals throughout the pregnancy to ensure the delivery of a healthy infant.  Most of your home care instructions are the same as  suggested for the first trimester of your pregnancy. Keep your caregiver's appointments. Follow your caregiver's instructions regarding medication use, exercise and diet.  During pregnancy, you are providing food for you and your baby. Continue to eat regular, well-balanced meals. Choose foods such as meat, fish, milk and other low fat dairy products, vegetables, fruits, and whole-grain breads and cereals. Your caregiver will tell you of the ideal weight gain.  A physical sexual relationship may be continued up until near the end of pregnancy if there are no other problems. Problems could include early (premature) leaking of amniotic fluid from the membranes, vaginal bleeding, abdominal pain, or other medical or pregnancy problems.  Exercise regularly if there are no restrictions. Check with your caregiver if you are unsure of the safety of some of your exercises. The greatest weight gain will occur in the last 2 trimesters of pregnancy. Exercise will help you:  Control your weight.  Get you in shape for labor and delivery.  Lose weight   after you have the baby.  Wear a good support or jogging bra for breast tenderness during pregnancy. This may help if worn during sleep. Pads or tissues may be used in the bra if you are leaking colostrum.  Do not use hot tubs, steam rooms or saunas throughout the pregnancy.  Wear your seat belt at all times when driving. This protects you and your baby if you are in an accident.  Avoid raw meat, uncooked cheese, cat litter boxes and soil used by cats. These carry germs that can cause birth defects in the baby.  The second trimester is also a good time to visit your dentist for your dental health if this has not been done yet. Getting your teeth cleaned is OK. Use a soft toothbrush. Brush gently during pregnancy.  It is easier to loose urine during pregnancy. Tightening up and strengthening the pelvic muscles will help with this problem. Practice stopping your  urination while you are going to the bathroom. These are the same muscles you need to strengthen. It is also the muscles you would use as if you were trying to stop from passing gas. You can practice tightening these muscles up 10 times a set and repeating this about 3 times per day. Once you know what muscles to tighten up, do not perform these exercises during urination. It is more likely to contribute to an infection by backing up the urine.  Ask for help if you have financial, counseling or nutritional needs during pregnancy. Your caregiver will be able to offer counseling for these needs as well as refer you for other special needs.  Your skin may become oily. If so, wash your face with mild soap, use non-greasy moisturizer and oil or cream based makeup. MEDICATIONS AND DRUG USE IN PREGNANCY  Take prenatal vitamins as directed. The vitamin should contain 1 milligram of folic acid. Keep all vitamins out of reach of children. Only a couple vitamins or tablets containing iron may be fatal to a baby or young child when ingested.  Avoid use of all medications, including herbs, over-the-counter medications, not prescribed or suggested by your caregiver. Only take over-the-counter or prescription medicines for pain, discomfort, or fever as directed by your caregiver. Do not use aspirin.  Let your caregiver also know about herbs you may be using.  Alcohol is related to a number of birth defects. This includes fetal alcohol syndrome. All alcohol, in any form, should be avoided completely. Smoking will cause low birth rate and premature babies.  Street or illegal drugs are very harmful to the baby. They are absolutely forbidden. A baby born to an addicted mother will be addicted at birth. The baby will go through the same withdrawal an adult does. SEEK MEDICAL CARE IF:  You have any concerns or worries during your pregnancy. It is better to call with your questions if you feel they cannot wait, rather  than worry about them. SEEK IMMEDIATE MEDICAL CARE IF:   An unexplained oral temperature above 102 F (38.9 C) develops, or as your caregiver suggests.  You have leaking of fluid from the vagina (birth canal). If leaking membranes are suspected, take your temperature and tell your caregiver of this when you call.  There is vaginal spotting, bleeding, or passing clots. Tell your caregiver of the amount and how many pads are used. Light spotting in pregnancy is common, especially following intercourse.  You develop a bad smelling vaginal discharge with a change in the color from clear   to white.  You continue to feel sick to your stomach (nauseated) and have no relief from remedies suggested. You vomit blood or coffee ground-like materials.  You lose more than 2 pounds of weight or gain more than 2 pounds of weight over 1 week, or as suggested by your caregiver.  You notice swelling of your face, hands, feet, or legs.  You get exposed to German measles and have never had them.  You are exposed to fifth disease or chickenpox.  You develop belly (abdominal) pain. Round ligament discomfort is a common non-cancerous (benign) cause of abdominal pain in pregnancy. Your caregiver still must evaluate you.  You develop a bad headache that does not go away.  You develop fever, diarrhea, pain with urination, or shortness of breath.  You develop visual problems, blurry, or double vision.  You fall or are in a car accident or any kind of trauma.  There is mental or physical violence at home. Document Released: 09/28/2001 Document Revised: 12/27/2011 Document Reviewed: 04/02/2009 [employer] Patient Information 2013 [employer].  

## 2013-02-22 LAB — GLUCOSE TOLERANCE, 1 HOUR (50G) W/O FASTING: Glucose, 1 Hour GTT: 72 mg/dL (ref 70–140)

## 2013-02-26 DIAGNOSIS — IMO0002 Reserved for concepts with insufficient information to code with codable children: Secondary | ICD-10-CM | POA: Insufficient documentation

## 2013-03-01 ENCOUNTER — Encounter: Payer: Self-pay | Admitting: *Deleted

## 2013-03-01 DIAGNOSIS — IMO0002 Reserved for concepts with insufficient information to code with codable children: Secondary | ICD-10-CM | POA: Insufficient documentation

## 2013-03-14 ENCOUNTER — Encounter (HOSPITAL_COMMUNITY): Payer: Self-pay | Admitting: *Deleted

## 2013-03-14 ENCOUNTER — Inpatient Hospital Stay (HOSPITAL_COMMUNITY)
Admission: AD | Admit: 2013-03-14 | Discharge: 2013-03-14 | Disposition: A | Discharge status: Home / Self Care | Payer: Medicaid Other | Source: Ambulatory Visit | Attending: Obstetrics & Gynecology | Admitting: Obstetrics & Gynecology

## 2013-03-14 DIAGNOSIS — R42 Dizziness and giddiness: Secondary | ICD-10-CM | POA: Insufficient documentation

## 2013-03-14 DIAGNOSIS — R51 Headache: Secondary | ICD-10-CM | POA: Insufficient documentation

## 2013-03-14 DIAGNOSIS — O99891 Other specified diseases and conditions complicating pregnancy: Secondary | ICD-10-CM | POA: Insufficient documentation

## 2013-03-14 DIAGNOSIS — IMO0002 Reserved for concepts with insufficient information to code with codable children: Secondary | ICD-10-CM

## 2013-03-14 HISTORY — DX: Chlamydial infection, unspecified: A74.9

## 2013-03-14 HISTORY — DX: Female pelvic inflammatory disease, unspecified: N73.9

## 2013-03-14 LAB — URINALYSIS, ROUTINE W REFLEX MICROSCOPIC
Glucose, UA: NEGATIVE mg/dL
Specific Gravity, Urine: >1.03 — ABNORMAL HIGH (ref 1.005–1.030)
pH: 6 (ref 5.0–8.0)

## 2013-03-14 LAB — CBC
HCT: 33.8 % — ABNORMAL LOW (ref 36.0–46.0)
Hemoglobin: 11.6 g/dL — ABNORMAL LOW (ref 12.0–15.0)
MCV: 89.2 fL (ref 78.0–100.0)
WBC: 8.2 10*3/uL (ref 4.0–10.5)

## 2013-03-14 LAB — COMPREHENSIVE METABOLIC PANEL
Alkaline Phosphatase: 74 U/L (ref 39–117)
BUN: 6 mg/dL (ref 6–23)
Chloride: 102 mEq/L (ref 96–112)
Creatinine, Ser: 0.45 mg/dL — ABNORMAL LOW (ref 0.50–1.10)
GFR calc Af Amer: >90 mL/min (ref >90)
GFR calc non Af Amer: >90 mL/min (ref >90)
Glucose, Bld: 87 mg/dL (ref 70–99)
Potassium: 4 mEq/L (ref 3.5–5.1)
Total Bilirubin: 0.1 mg/dL — ABNORMAL LOW (ref 0.3–1.2)

## 2013-03-14 LAB — URINE MICROSCOPIC-ADD ON

## 2013-03-14 NOTE — MAU Provider Note (Signed)
Attestation of Attending Supervision of Advanced Practitioner (CNM/NP): Evaluation and management procedures were performed by the Advanced Practitioner under my supervision and collaboration.  I have reviewed the Advanced Practitioner's note and chart, and I agree with the management and plan.  HARRAWAY-SMITH, Elgie Landino 3:33 PM

## 2013-03-14 NOTE — MAU Note (Signed)
Patient states that she started having a headache this am, has been having periods of feeling dizzy for a while. Feels tired. Denies bleeding or leaking and reports fetal movement.

## 2013-03-14 NOTE — MAU Provider Note (Signed)
History     CSN: [id number]  Arrival date and time: 03/14/13 1025   None     Chief Complaint  Patient presents with  . Headache  . Dizziness  . Fatigue   HPI  Pt is a G5P3013 at 25.3 wks IUP here with report of headache, dizziness and fatigue off and on x one month.  Reports having one prenatal visit in the [facility] clinic.  No abdominal pain or vaginal bleeding.  No problems with food or liquid intake.     Past Medical History  Diagnosis Date  . Chlamydia   . Pelvic inflammatory disease (PID)     Past Surgical History  Procedure Laterality Date  . Induced abortion      Family History  Problem Relation Age of Onset  . Hypertension Mother   . Lupus Sister   . Sickle cell trait Sister   . Sickle cell trait Brother   . Hypertension Maternal Grandmother   . Heart disease Maternal Grandmother   . Lupus Maternal Grandfather   . Diabetes Maternal Aunt   . Hypertension Maternal Aunt     History  Substance Use Topics  . Smoking status: Never Smoker   . Smokeless tobacco: Never Used  . Alcohol Use: No    Allergies: No Known Allergies  Prescriptions prior to admission  Medication Sig Dispense Refill  . Prenatal Vit-Fe Fumarate-FA (PRENATAL MULTIVITAMIN) TABS Take 1 tablet by mouth daily at 12 noon.        Review of Systems  Constitutional: Positive for malaise/fatigue. Negative for fever and chills.  HENT: Negative for sore throat.   Respiratory: Negative for shortness of breath.   Cardiovascular: Negative for chest pain.  Gastrointestinal: Negative for abdominal pain.  Genitourinary: Negative for dysuria, urgency, frequency and hematuria.  Neurological: Positive for headaches.   Physical Exam   Blood pressure 116/52, pulse 94, temperature 98.8 F (37.1 C), temperature source Oral, resp. rate 16, height 5' 4.5" (1.638 m), weight 104.418 kg (230 lb 3.2 oz), last menstrual period 09/17/2012, SpO2 100.00%.  Physical Exam  Constitutional: She is  oriented to person, place, and time. She appears well-developed and well-nourished.  HENT:  Head: Normocephalic.  Eyes: EOM are normal. Pupils are equal, round, and reactive to light.  Neck: Normal range of motion. Neck supple.  Cardiovascular: Normal rate, regular rhythm and normal heart sounds.   Respiratory: Effort normal and breath sounds normal.  Genitourinary: No bleeding around the vagina.  Neurological: She is alert and oriented to person, place, and time.  Skin: Skin is warm and dry. No pallor.    MAU Course  Procedures  Results for orders placed during the hospital encounter of 03/14/13 (from the past 24 hour(s))  URINALYSIS, ROUTINE W REFLEX MICROSCOPIC     Status: Abnormal   Collection Time    03/14/13 10:50 AM      Result Value Range   Color, Urine YELLOW  YELLOW   APPearance HAZY (*) CLEAR   Specific Gravity, Urine >1.030 (*) 1.005 - 1.030   pH 6.0  5.0 - 8.0   Glucose, UA NEGATIVE  NEGATIVE mg/dL   Hgb urine dipstick NEGATIVE  NEGATIVE   Bilirubin Urine NEGATIVE  NEGATIVE   Ketones, ur 15 (*) NEGATIVE mg/dL   Protein, ur NEGATIVE  NEGATIVE mg/dL   Urobilinogen, UA 0.2  0.0 - 1.0 mg/dL   Nitrite NEGATIVE  NEGATIVE   Leukocytes, UA TRACE (*) NEGATIVE  URINE MICROSCOPIC-ADD ON     Status: Abnormal  Collection Time    03/14/13 10:50 AM      Result Value Range   Squamous Epithelial / LPF FEW (*) RARE   WBC, UA 0-2  <3 WBC/hpf   Bacteria, UA FEW (*) RARE  CBC     Status: Abnormal   Collection Time    03/14/13 12:15 PM      Result Value Range   WBC 8.2  4.0 - 10.5 K/uL   RBC 3.79 (*) 3.87 - 5.11 MIL/uL   Hemoglobin 11.6 (*) 12.0 - 15.0 g/dL   HCT 16.1 (*) 09.6 - 04.5 %   MCV 89.2  78.0 - 100.0 fL   MCH 30.6  26.0 - 34.0 pg   MCHC 34.3  30.0 - 36.0 g/dL   RDW 40.9  81.1 - 91.4 %   Platelets 186  150 - 400 K/uL  COMPREHENSIVE METABOLIC PANEL     Status: Abnormal   Collection Time    03/14/13 12:15 PM      Result Value Range   Sodium 134 (*) 135 - 145  mEq/L   Potassium 4.0  3.5 - 5.1 mEq/L   Chloride 102  96 - 112 mEq/L   CO2 25  19 - 32 mEq/L   Glucose, Bld 87  70 - 99 mg/dL   BUN 6  6 - 23 mg/dL   Creatinine, Ser 7.82 (*) 0.50 - 1.10 mg/dL   Calcium 9.3  8.4 - 95.6 mg/dL   Total Protein 6.7  6.0 - 8.3 g/dL   Albumin 2.9 (*) 3.5 - 5.2 g/dL   AST 15  0 - 37 U/L   ALT 19  0 - 35 U/L   Alkaline Phosphatase 74  39 - 117 U/L   Total Bilirubin 0.1 (*) 0.3 - 1.2 mg/dL   GFR calc non Af Amer >90  >90 mL/min   GFR calc Af Amer >90  >90 mL/min     Assessment and Plan  Dizziness in Pregnancy - normal exam/labs Category I FHR Tracing  Plan: DC to home  Increase fluids Tylenol for headache Small frequent meals Increase rest  [facility] 03/14/2013, 11:59 AM

## 2013-03-21 ENCOUNTER — Ambulatory Visit (INDEPENDENT_AMBULATORY_CARE_PROVIDER_SITE_OTHER): Payer: Medicaid Other | Admitting: Advanced Practice Midwife

## 2013-03-21 VITALS — BP 127/76 | Temp 97.1°F | Wt 232.8 lb

## 2013-03-21 DIAGNOSIS — Z3480 Encounter for supervision of other normal pregnancy, unspecified trimester: Secondary | ICD-10-CM

## 2013-03-21 DIAGNOSIS — Z3483 Encounter for supervision of other normal pregnancy, third trimester: Secondary | ICD-10-CM

## 2013-03-21 NOTE — Progress Notes (Signed)
P-85 

## 2013-03-21 NOTE — Progress Notes (Signed)
Repeat GTT in 3 weeks. F/U scan with MFM for low afi @ 21 weeks.

## 2013-03-21 NOTE — Progress Notes (Signed)
C/W Dr. Erin Fulling, recommends repeat ultrasound with MFM for detailed review  AFI and anatomy.   I have seen the patient with the resident/student and agree with the above.  Susan [name]

## 2013-03-22 LAB — POCT URINALYSIS DIP (DEVICE)
Glucose, UA: NEGATIVE mg/dL
Nitrite: NEGATIVE
Urobilinogen, UA: 1 mg/dL (ref 0.0–1.0)

## 2013-03-26 ENCOUNTER — Ambulatory Visit (HOSPITAL_COMMUNITY)
Admission: RE | Admit: 2013-03-26 | Discharge: 2013-03-26 | Disposition: A | Discharge status: Home / Self Care | Payer: Medicaid Other | Source: Ambulatory Visit | Attending: Advanced Practice Midwife | Admitting: Advanced Practice Midwife

## 2013-03-26 DIAGNOSIS — Z3689 Encounter for other specified antenatal screening: Secondary | ICD-10-CM | POA: Insufficient documentation

## 2013-03-26 DIAGNOSIS — O4100X0 Oligohydramnios, unspecified trimester, not applicable or unspecified: Secondary | ICD-10-CM | POA: Insufficient documentation

## 2013-03-26 DIAGNOSIS — Z3483 Encounter for supervision of other normal pregnancy, third trimester: Secondary | ICD-10-CM

## 2013-03-26 NOTE — Progress Notes (Signed)
Susan [name]  was seen today for an ultrasound appointment.  See full report in [facility].  Impression: Single IUP at 27 1/7 weeks Normal interval anatomy - the anatomic survey is now complete Interval growth is appropriate (57th %tile) Normal amniotic fluid volume (AFI 12.2 cm)  Recommendations: Follow-up ultrasounds as clinically indicated.   Alpha Gula, MD

## 2013-04-08 ENCOUNTER — Inpatient Hospital Stay (HOSPITAL_COMMUNITY)
Admission: AD | Admit: 2013-04-08 | Discharge: 2013-04-08 | Disposition: A | Discharge status: Home / Self Care | Payer: Medicaid Other | Source: Ambulatory Visit | Attending: Obstetrics & Gynecology | Admitting: Obstetrics & Gynecology

## 2013-04-08 ENCOUNTER — Encounter (HOSPITAL_COMMUNITY): Payer: Self-pay | Admitting: Family

## 2013-04-08 DIAGNOSIS — R51 Headache: Secondary | ICD-10-CM | POA: Insufficient documentation

## 2013-04-08 DIAGNOSIS — IMO0002 Reserved for concepts with insufficient information to code with codable children: Secondary | ICD-10-CM

## 2013-04-08 LAB — URINE MICROSCOPIC-ADD ON

## 2013-04-08 LAB — URINALYSIS, ROUTINE W REFLEX MICROSCOPIC
Nitrite: NEGATIVE
Protein, ur: NEGATIVE mg/dL
Urobilinogen, UA: 0.2 mg/dL (ref 0.0–1.0)

## 2013-04-08 NOTE — MAU Provider Note (Signed)
Attestation of Attending Supervision of Advanced Practitioner (CNM/NP): Evaluation and management procedures were performed by the Advanced Practitioner under my supervision and collaboration. I have reviewed the Advanced Practitioner's note and chart, and I agree with the management and plan.  Ivey Cina H. 7:18 PM

## 2013-04-08 NOTE — MAU Provider Note (Signed)
History     CSN: [id number]  Arrival date and time: 04/08/13 1555   None     Chief Complaint  Patient presents with  . Foot Swelling  . Headache   HPI This is a 27 y.o. female at [redacted]w[redacted]d who presents with c/o swollen feet. Had a day at work yesterday when she was on her feet all day. Denies headache, visual changes, or abdominal pain. No history of hypertension. Sees [place] in [facility] clinic. No swelling other than feet.   RN Note: Patient presents to MAU with c/o intermittent headache for several days. Reports swelling in feet and legs since yesterday. Denies blurred vision, RUQ pain, or other lateralizing s/s.  Reports she is on her feet for work, although hasn't worked since Friday. Noticed feet and legs were swollen after walking around a lot yesterday.       OB History   Grav Para Term Preterm Abortions TAB SAB Ect Mult Living   5 3 3  0 1 1 0 0 0 3      Past Medical History  Diagnosis Date  . Chlamydia   . Pelvic inflammatory disease (PID)     Past Surgical History  Procedure Laterality Date  . Induced abortion      Family History  Problem Relation Age of Onset  . Hypertension Mother   . Lupus Sister   . Sickle cell trait Sister   . Sickle cell trait Brother   . Hypertension Maternal Grandmother   . Heart disease Maternal Grandmother   . Lupus Maternal Grandfather   . Diabetes Maternal Aunt   . Hypertension Maternal Aunt     History  Substance Use Topics  . Smoking status: Never Smoker   . Smokeless tobacco: Never Used  . Alcohol Use: No    Allergies: No Known Allergies  Prescriptions prior to admission  Medication Sig Dispense Refill  . Prenatal Vit-Fe Fumarate-FA (PRENATAL MULTIVITAMIN) TABS Take 1 tablet by mouth daily at 12 noon.        Review of Systems  Constitutional: Negative for fever, chills and malaise/fatigue.  Eyes: Negative for blurred vision and double vision.  Gastrointestinal: Negative for nausea, vomiting, abdominal pain,  diarrhea and constipation.  Genitourinary: Negative for dysuria.  Neurological: Negative for dizziness, sensory change and headaches.  Endo/Heme/Allergies:       Swelling in feet   Physical Exam   Blood pressure 124/57, pulse 93, temperature 98.3 F (36.8 C), temperature source Oral, resp. rate 18, height 5\' 4"  (1.626 m), weight 106.777 kg (235 lb 6.4 oz), last menstrual period 09/17/2012.  Filed Vitals:   04/08/13 1610  BP: 124/57  Pulse: 93  Temp: 98.3 F (36.8 C)  TempSrc: Oral  Resp: 18  Height: 5\' 4"  (1.626 m)  Weight: 106.777 kg (235 lb 6.4 oz)    Physical Exam  Constitutional: She is oriented to person, place, and time. She appears well-developed and well-nourished. No distress.  HENT:  Head: Normocephalic.  Neck: Normal range of motion.  Cardiovascular: Normal rate.   Respiratory: Effort normal.  GI: Soft. She exhibits no distension and no mass. There is no tenderness. There is no rebound and no guarding.  Musculoskeletal: Normal range of motion. She exhibits edema (Trace in feet). She exhibits no tenderness.  Neurological: She is alert and oriented to person, place, and time. She has normal reflexes. She displays normal reflexes. She exhibits normal muscle tone.  Skin: Skin is warm and dry.  Psychiatric: She has a normal mood  and affect.    MAU Course  Procedures  MDM Fetal heart rate reactive No contractions   Assessment and Plan  A:  SIUP at [redacted]w[redacted]d        Pedal edema, trace      No evidence of preeclampsia  P:  Discharge home       Discussed hydration, feet elevation, and pool immersion       Follow up in clinic  [facility] 04/08/2013, 4:56 PM

## 2013-04-08 NOTE — MAU Note (Signed)
Pt reports her feet are swollen today and she has a headache and having some vomiting as well. Has been on her feet  Most of the day today.

## 2013-04-08 NOTE — MAU Note (Signed)
Patient presents to MAU with c/o intermittent headache for several days. Reports swelling in feet and legs since yesterday. Denies blurred vision, RUQ pain, or other lateralizing s/s.  Reports she is on her feet for work, although hasn't worked since Friday. Noticed feet and legs were swollen after walking around a lot yesterday.

## 2013-04-10 LAB — URINE CULTURE: Colony Count: 50000

## 2013-04-11 ENCOUNTER — Encounter: Payer: Self-pay | Admitting: Advanced Practice Midwife

## 2013-04-11 ENCOUNTER — Ambulatory Visit (INDEPENDENT_AMBULATORY_CARE_PROVIDER_SITE_OTHER): Payer: Medicaid Other | Admitting: Advanced Practice Midwife

## 2013-04-11 VITALS — BP 133/74 | Temp 98.7°F | Wt 232.0 lb

## 2013-04-11 DIAGNOSIS — Z3483 Encounter for supervision of other normal pregnancy, third trimester: Secondary | ICD-10-CM

## 2013-04-11 DIAGNOSIS — Z3480 Encounter for supervision of other normal pregnancy, unspecified trimester: Secondary | ICD-10-CM

## 2013-04-11 LAB — CBC
MCH: 30 pg (ref 26.0–34.0)
MCV: 87.9 fL (ref 78.0–100.0)
Platelets: 189 10*3/uL (ref 150–400)
RDW: 14.1 % (ref 11.5–15.5)
WBC: 6.3 10*3/uL (ref 4.0–10.5)

## 2013-04-11 LAB — POCT URINALYSIS DIP (DEVICE)
Glucose, UA: NEGATIVE mg/dL
Nitrite: NEGATIVE
Urobilinogen, UA: 0.2 mg/dL (ref 0.0–1.0)

## 2013-04-11 NOTE — Progress Notes (Signed)
Pulse-105 

## 2013-04-11 NOTE — Patient Instructions (Addendum)

## 2013-04-11 NOTE — Progress Notes (Signed)
Doing well. Thinks ankles are swollen, but only show trace edema. No headache or visual changes. Reviewed signs of PIH. Repeat US by Dr Claudean Severance was normal.

## 2013-04-25 ENCOUNTER — Ambulatory Visit (INDEPENDENT_AMBULATORY_CARE_PROVIDER_SITE_OTHER): Payer: Medicaid Other | Admitting: Advanced Practice Midwife

## 2013-04-25 VITALS — BP 120/76 | Wt 234.4 lb

## 2013-04-25 DIAGNOSIS — Z3483 Encounter for supervision of other normal pregnancy, third trimester: Secondary | ICD-10-CM

## 2013-04-25 DIAGNOSIS — IMO0002 Reserved for concepts with insufficient information to code with codable children: Secondary | ICD-10-CM

## 2013-04-25 DIAGNOSIS — L299 Pruritus, unspecified: Secondary | ICD-10-CM

## 2013-04-25 LAB — POCT URINALYSIS DIP (DEVICE)
Glucose, UA: NEGATIVE mg/dL
Hgb urine dipstick: NEGATIVE
Nitrite: NEGATIVE
Protein, ur: NEGATIVE mg/dL
Urobilinogen, UA: 0.2 mg/dL (ref 0.0–1.0)
pH: 6 (ref 5.0–8.0)

## 2013-04-25 NOTE — Progress Notes (Signed)
Pulse: 100 Pt reports itching all over her whole body.

## 2013-04-25 NOTE — Progress Notes (Signed)
Doing well.  Good fetal movement, denies vaginal bleeding, LOF, regular contractions.  Pt reports itching x3 weeks increasing in intensity last 3 days.  She denies rash, change in detergent, exposure to allergens.  Bile acids drawn today.  Ok to take benadryl for itching.

## 2013-05-01 DIAGNOSIS — L299 Pruritus, unspecified: Secondary | ICD-10-CM | POA: Insufficient documentation

## 2013-05-06 ENCOUNTER — Encounter (HOSPITAL_COMMUNITY): Payer: Self-pay | Admitting: *Deleted

## 2013-05-06 ENCOUNTER — Inpatient Hospital Stay (HOSPITAL_COMMUNITY)
Admission: AD | Admit: 2013-05-06 | Discharge: 2013-05-06 | Disposition: A | Discharge status: Home / Self Care | Payer: Medicaid Other | Source: Ambulatory Visit | Attending: Obstetrics & Gynecology | Admitting: Obstetrics & Gynecology

## 2013-05-06 DIAGNOSIS — O99891 Other specified diseases and conditions complicating pregnancy: Secondary | ICD-10-CM | POA: Insufficient documentation

## 2013-05-06 DIAGNOSIS — Y9229 Other specified public building as the place of occurrence of the external cause: Secondary | ICD-10-CM | POA: Insufficient documentation

## 2013-05-06 DIAGNOSIS — R1011 Right upper quadrant pain: Secondary | ICD-10-CM | POA: Insufficient documentation

## 2013-05-06 DIAGNOSIS — IMO0002 Reserved for concepts with insufficient information to code with codable children: Secondary | ICD-10-CM | POA: Insufficient documentation
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
## 2013-05-06 NOTE — H&P (Signed)
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
## 2013-05-09 ENCOUNTER — Ambulatory Visit (INDEPENDENT_AMBULATORY_CARE_PROVIDER_SITE_OTHER): Payer: Medicaid Other | Admitting: Family Medicine

## 2013-05-09 VITALS — BP 130/73 | Temp 98.4°F | Wt 240.1 lb

## 2013-05-09 DIAGNOSIS — Z3483 Encounter for supervision of other normal pregnancy, third trimester: Secondary | ICD-10-CM

## 2013-05-09 DIAGNOSIS — IMO0002 Reserved for concepts with insufficient information to code with codable children: Secondary | ICD-10-CM

## 2013-05-09 LAB — POCT URINALYSIS DIP (DEVICE)
Hgb urine dipstick: NEGATIVE
Nitrite: NEGATIVE
Urobilinogen, UA: 0.2 mg/dL (ref 0.0–1.0)
pH: 7 (ref 5.0–8.0)

## 2013-05-09 NOTE — Progress Notes (Signed)
Pulse- 94 Patient reports lower abdominal pressure and occasional nonpainful contractions

## 2013-05-09 NOTE — Progress Notes (Signed)
BH contractions.  Itching is improved, bile acids 9. Will recheck if symptoms worsen.

## 2013-05-09 NOTE — Patient Instructions (Addendum)
Pregnancy - Third Trimester The third trimester of pregnancy (the last 3 months) is a period of the most rapid growth for you and your baby. The baby approaches a length of 20 inches and a weight of 6 to 10 pounds. The baby is adding on fat and getting ready for life outside your body. While inside, babies have periods of sleeping and waking, sucking thumbs, and hiccuping. You can often feel small contractions of the uterus. This is false labor. It is also called Braxton-Hicks contractions. This is like a practice for labor. The usual problems in this stage of pregnancy include more difficulty breathing, swelling of the hands and feet from water retention, and having to urinate more often because of the uterus and baby pressing on your bladder.  PRENATAL EXAMS  Blood work may continue to be done during prenatal exams. These tests are done to check on your health and the probable health of your baby. Blood work is used to follow your blood levels (hemoglobin). Anemia (low hemoglobin) is common during pregnancy. Iron and vitamins are given to help prevent this. You may also continue to be checked for diabetes. Some of the past blood tests may be done again.  The size of the uterus is measured during each visit. This makes sure your baby is growing properly according to your pregnancy dates.  Your blood pressure is checked every prenatal visit. This is to make sure you are not getting toxemia.  Your urine is checked every prenatal visit for infection, diabetes, and protein.  Your weight is checked at each visit. This is done to make sure gains are happening at the suggested rate and that you and your baby are growing normally.  Sometimes, an ultrasound is performed to confirm the position and the proper growth and development of the baby. This is a test done that bounces harmless sound waves off the baby so your caregiver can more accurately determine a due date.  Discuss the type of pain medicine and  anesthesia you will have during your labor and delivery.  Discuss the possibility and anesthesia if a cesarean section might be necessary.  Inform your caregiver if there is any mental or physical violence at home. Sometimes, a specialized non-stress test, contraction stress test, and biophysical profile are done to make sure the baby is not having a problem. Checking the amniotic fluid surrounding the baby is called an amniocentesis. The amniotic fluid is removed by sticking a needle into the belly (abdomen). This is sometimes done near the end of pregnancy if an early delivery is required. In this case, it is done to help make sure the baby's lungs are mature enough for the baby to live outside of the womb. If the lungs are not mature and it is unsafe to deliver the baby, an injection of cortisone medicine is given to the mother 1 to 2 days before the delivery. This helps the baby's lungs mature and makes it safer to deliver the baby. CHANGES OCCURING IN THE THIRD TRIMESTER OF PREGNANCY Your body goes through many changes during pregnancy. They vary from person to person. Talk to your caregiver about changes you notice and are concerned about.  During the last trimester, you have probably had an increase in your appetite. It is normal to have cravings for certain foods. This varies from person to person and pregnancy to pregnancy.  You may begin to get stretch marks on your hips, abdomen, and breasts. These are normal changes in the body   during pregnancy. There are no exercises or medicines to take which prevent this change.  Constipation may be treated with a stool softener or adding bulk to your diet. Drinking lots of fluids, fiber in vegetables, fruits, and whole grains are helpful.  Exercising is also helpful. If you have been very active up until your pregnancy, most of these activities can be continued during your pregnancy. If you have been less active, it is helpful to start an exercise  program such as walking. Consult your caregiver before starting exercise programs.  Avoid all smoking, alcohol, non-prescribed drugs, herbs and "street drugs" during your pregnancy. These chemicals affect the formation and growth of the baby. Avoid chemicals throughout the pregnancy to ensure the delivery of a healthy infant.  Backache, varicose veins, and hemorrhoids may develop or get worse.  You will tire more easily in the third trimester, which is normal.  The baby's movements may be stronger and more often.  You may become short of breath easily.  Your belly button may stick out.  A yellow discharge may leak from your breasts called colostrum.  You may have a bloody mucus discharge. This usually occurs a few days to a week before labor begins. HOME CARE INSTRUCTIONS   Keep your caregiver's appointments. Follow your caregiver's instructions regarding medicine use, exercise, and diet.  During pregnancy, you are providing food for you and your baby. Continue to eat regular, well-balanced meals. Choose foods such as meat, fish, milk and other low fat dairy products, vegetables, fruits, and whole-grain breads and cereals. Your caregiver will tell you of the ideal weight gain.  A physical sexual relationship may be continued throughout pregnancy if there are no other problems such as early (premature) leaking of amniotic fluid from the membranes, vaginal bleeding, or belly (abdominal) pain.  Exercise regularly if there are no restrictions. Check with your caregiver if you are unsure of the safety of your exercises. Greater weight gain will occur in the last 2 trimesters of pregnancy. Exercising helps:  Control your weight.  Get you in shape for labor and delivery.  You lose weight after you deliver.  Rest a lot with legs elevated, or as needed for leg cramps or low back pain.  Wear a good support or jogging bra for breast tenderness during pregnancy. This may help if worn during  sleep. Pads or tissues may be used in the bra if you are leaking colostrum.  Do not use hot tubs, steam rooms, or saunas.  Wear your seat belt when driving. This protects you and your baby if you are in an accident.  Avoid raw meat, cat litter boxes and soil used by cats. These carry germs that can cause birth defects in the baby.  It is easier to leak urine during pregnancy. Tightening up and strengthening the pelvic muscles will help with this problem. You can practice stopping your urination while you are going to the bathroom. These are the same muscles you need to strengthen. It is also the muscles you would use if you were trying to stop from passing gas. You can practice tightening these muscles up 10 times a set and repeating this about 3 times per day. Once you know what muscles to tighten up, do not perform these exercises during urination. It is more likely to cause an infection by backing up the urine.  Ask for help if you have financial, counseling, or nutritional needs during pregnancy. Your caregiver will be able to offer counseling for these   needs as well as refer you for other special needs.  Make a list of emergency phone numbers and have them available.  Plan on getting help from family or friends when you go home from the hospital.  Make a trial run to the hospital.  Take prenatal classes with the father to understand, practice, and ask questions about the labor and delivery.  Prepare the baby's room or nursery.  Do not travel out of the city unless it is absolutely necessary and with the advice of your caregiver.  Wear only low or no heal shoes to have better balance and prevent falling. MEDICINES AND DRUG USE IN PREGNANCY  Take prenatal vitamins as directed. The vitamin should contain 1 milligram of folic acid. Keep all vitamins out of reach of children. Only a couple vitamins or tablets containing iron may be fatal to a baby or young child when ingested.  Avoid use  of all medicines, including herbs, over-the-counter medicines, not prescribed or suggested by your caregiver. Only take over-the-counter or prescription medicines for pain, discomfort, or fever as directed by your caregiver. Do not use aspirin, ibuprofen or naproxen unless approved by your caregiver.  Let your caregiver also know about herbs you may be using.  Alcohol is related to a number of birth defects. This includes fetal alcohol syndrome. All alcohol, in any form, should be avoided completely. Smoking will cause low birth rate and premature babies.  Illegal drugs are very harmful to the baby. They are absolutely forbidden. A baby born to an addicted mother will be addicted at birth. The baby will go through the same withdrawal an adult does. SEEK MEDICAL CARE IF: You have any concerns or worries during your pregnancy. It is better to call with your questions if you feel they cannot wait, rather than worry about them. SEEK IMMEDIATE MEDICAL CARE IF:   An unexplained oral temperature above 102 F (38.9 C) develops, or as your caregiver suggests.  You have leaking of fluid from the vagina. If leaking membranes are suspected, take your temperature and tell your caregiver of this when you call.  There is vaginal spotting, bleeding or passing clots. Tell your caregiver of the amount and how many pads are used.  You develop a bad smelling vaginal discharge with a change in the color from clear to white.  You develop vomiting that lasts more than 24 hours.  You develop chills or fever.  You develop shortness of breath.  You develop burning on urination.  You loose more than 2 pounds of weight or gain more than 2 pounds of weight or as suggested by your caregiver.  You notice sudden swelling of your face, hands, and feet or legs.  You develop belly (abdominal) pain. Round ligament discomfort is a common non-cancerous (benign) cause of abdominal pain in pregnancy. Your caregiver still  must evaluate you.  You develop a severe headache that does not go away.  You develop visual problems, blurred or double vision.  If you have not felt your baby move for more than 1 hour. If you think the baby is not moving as much as usual, eat something with sugar in it and lie down on your left side for an hour. The baby should move at least 4 to 5 times per hour. Call right away if your baby moves less than that.  You fall, are in a car accident, or any kind of trauma.  There is mental or physical violence at home. Document Released: 09/28/2001   Document Revised: 06/28/2012 Document Reviewed: 04/02/2009 [facility] Patient Information 2014 [place], [place].  Levonorgestrel intrauterine device (IUD) What is this medicine? LEVONORGESTREL IUD (LEE voe nor jes trel) is a contraceptive (birth control) device. The device is placed inside the uterus by a healthcare professional. It is used to prevent pregnancy and can also be used to treat heavy bleeding that occurs during your period. Depending on the device, it can be used for 3 to 5 years. This medicine may be used for other purposes; ask your health care provider or pharmacist if you have questions. What should I tell my health care provider before I take this medicine? They need to know if you have any of these conditions: -abnormal Pap smear -cancer of the breast, uterus, or cervix -diabetes -endometritis -genital or pelvic infection now or in the past -have more than one sexual partner or your partner has more than one partner -heart disease -history of an ectopic or tubal pregnancy -immune system problems -IUD in place -liver disease or tumor -problems with blood clots or take blood-thinners -use intravenous drugs -uterus of unusual shape -vaginal bleeding that has not been explained -an unusual or allergic reaction to levonorgestrel, other hormones, silicone, or polyethylene, medicines, foods, dyes, or preservatives -pregnant or  trying to get pregnant -breast-feeding How should I use this medicine? This device is placed inside the uterus by a health care professional. Talk to your pediatrician regarding the use of this medicine in children. Special care may be needed. Overdosage: If you think you have taken too much of this medicine contact a poison control center or emergency room at once. NOTE: This medicine is only for you. Do not share this medicine with others. What if I miss a dose? This does not apply. What may interact with this medicine? Do not take this medicine with any of the following medications: -amprenavir -bosentan -fosamprenavir This medicine may also interact with the following medications: -aprepitant -barbiturate medicines for inducing sleep or treating seizures -bexarotene -griseofulvin -medicines to treat seizures like carbamazepine, ethotoin, felbamate, oxcarbazepine, phenytoin, topiramate -modafinil -pioglitazone -rifabutin -rifampin -rifapentine -some medicines to treat HIV infection like atazanavir, indinavir, lopinavir, nelfinavir, tipranavir, ritonavir -St. John's wort -warfarin This list may not describe all possible interactions. Give your health care provider a list of all the medicines, herbs, non-prescription drugs, or dietary supplements you use. Also tell them if you smoke, drink alcohol, or use illegal drugs. Some items may interact with your medicine. What should I watch for while using this medicine? Visit your doctor or health care professional for regular check ups. See your doctor if you or your partner has sexual contact with others, becomes HIV positive, or gets a sexual transmitted disease. This product does not protect you against HIV infection (AIDS) or other sexually transmitted diseases. You can check the placement of the IUD yourself by reaching up to the top of your vagina with clean fingers to feel the threads. Do not pull on the threads. It is a good habit  to check placement after each menstrual period. Call your doctor right away if you feel more of the IUD than just the threads or if you cannot feel the threads at all. The IUD may come out by itself. You may become pregnant if the device comes out. If you notice that the IUD has come out use a backup birth control method like condoms and call your health care provider. Using tampons will not change the position of the IUD and are okay to  use during your period. What side effects may I notice from receiving this medicine? Side effects that you should report to your doctor or health care professional as soon as possible: -allergic reactions like skin rash, itching or hives, swelling of the face, lips, or tongue -fever, flu-like symptoms -genital sores -high blood pressure -no menstrual period for 6 weeks during use -pain, swelling, warmth in the leg -pelvic pain or tenderness -severe or sudden headache -signs of pregnancy -stomach cramping -sudden shortness of breath -trouble with balance, talking, or walking -unusual vaginal bleeding, discharge -yellowing of the eyes or skin Side effects that usually do not require medical attention (report to your doctor or health care professional if they continue or are bothersome): -acne -breast pain -change in sex drive or performance -changes in weight -cramping, dizziness, or faintness while the device is being inserted -headache -irregular menstrual bleeding within first 3 to 6 months of use -nausea This list may not describe all possible side effects. Call your doctor for medical advice about side effects. You may report side effects to FDA at 1-800-FDA-1088. Where should I keep my medicine? This does not apply. NOTE: This sheet is a summary. It may not cover all possible information. If you have questions about this medicine, talk to your doctor, pharmacist, or health care provider.  2013, [employer]. (11/04/2011 1:54:04  PM)  Deberah Pelton Contractions Pregnancy is commonly associated with contractions of the uterus throughout the pregnancy. Towards the end of pregnancy (32 to 34 weeks), these contractions [facility]) can develop more often and may become more forceful. This is not true labor because these contractions do not result in opening (dilatation) and thinning of the cervix. They are sometimes difficult to tell apart from true labor because these contractions can be forceful and people have different pain tolerances. You should not feel embarrassed if you go to the hospital with false labor. Sometimes, the only way to tell if you are in true labor is for your caregiver to follow the changes in the cervix. How to tell the difference between true and false labor:  False labor.  The contractions of false labor are usually shorter, irregular and not as hard as those of true labor.  They are often felt in the front of the lower abdomen and in the groin.  They may leave with walking around or changing positions while lying down.  They get weaker and are shorter lasting as time goes on.  These contractions are usually irregular.  They do not usually become progressively stronger, regular and closer together as with true labor.  True labor.  Contractions in true labor last 30 to 70 seconds, become very regular, usually become more intense, and increase in frequency.  They do not go away with walking.  The discomfort is usually felt in the top of the uterus and spreads to the lower abdomen and low back.  True labor can be determined by your caregiver with an exam. This will show that the cervix is dilating and getting thinner. If there are no prenatal problems or other health problems associated with the pregnancy, it is completely safe to be sent home with false labor and await the onset of true labor. HOME CARE INSTRUCTIONS   Keep up with your usual exercises and instructions.  Take  medications as directed.  Keep your regular prenatal appointment.  Eat and drink lightly if you think you are going into labor.  If BH contractions are making you uncomfortable:  Change  your activity position from lying down or resting to walking/walking to resting.  Sit and rest in a tub of warm water.  Drink 2 to 3 glasses of water. Dehydration may cause B-H contractions.  Do slow and deep breathing several times an hour. SEEK IMMEDIATE MEDICAL CARE IF:   Your contractions continue to become stronger, more regular, and closer together.  You have a gushing, burst or leaking of fluid from the vagina.  An oral temperature above 102 F (38.9 C) develops.  You have passage of blood-tinged mucus.  You develop vaginal bleeding.  You develop continuous belly (abdominal) pain.  You have low back pain that you never had before.  You feel the baby's head pushing down causing pelvic pressure.  The baby is not moving as much as it used to. Document Released: 10/04/2005 Document Revised: 12/27/2011 Document Reviewed: 03/28/2009 [facility] Patient Information 2014 [place], [place].

## 2013-05-23 ENCOUNTER — Ambulatory Visit (INDEPENDENT_AMBULATORY_CARE_PROVIDER_SITE_OTHER): Payer: Medicaid Other | Admitting: Advanced Practice Midwife

## 2013-05-23 ENCOUNTER — Encounter: Payer: Self-pay | Admitting: Advanced Practice Midwife

## 2013-05-23 VITALS — BP 134/89 | Temp 98.7°F | Wt 239.9 lb

## 2013-05-23 DIAGNOSIS — L299 Pruritus, unspecified: Secondary | ICD-10-CM

## 2013-05-23 DIAGNOSIS — IMO0002 Reserved for concepts with insufficient information to code with codable children: Secondary | ICD-10-CM

## 2013-05-23 DIAGNOSIS — R8290 Unspecified abnormal findings in urine: Secondary | ICD-10-CM

## 2013-05-23 DIAGNOSIS — R82998 Other abnormal findings in urine: Secondary | ICD-10-CM

## 2013-05-23 LAB — POCT URINALYSIS DIP (DEVICE)
Nitrite: NEGATIVE
Protein, ur: NEGATIVE mg/dL
pH: 6 (ref 5.0–8.0)

## 2013-05-23 LAB — OB RESULTS CONSOLE GBS: GBS: NEGATIVE

## 2013-05-23 LAB — OB RESULTS CONSOLE GC/CHLAMYDIA: Chlamydia: NEGATIVE

## 2013-05-23 MED ORDER — HYDROXYZINE PAMOATE 25 MG PO CAPS: 25.0000 mg | ORAL_CAPSULE | Freq: Three times a day (TID) | ORAL | Status: DC | PRN

## 2013-05-23 NOTE — Progress Notes (Signed)
Doing well. Cultures done.  Labor precautions reviewed.   Still itching, but does not want to do bile salts lab today. Will do it next week maybe. Rx for vistaril.

## 2013-05-23 NOTE — Addendum Note (Signed)
Addended by: Franchot Mimes on: 05/23/2013 11:21 AM   Modules accepted: Orders

## 2013-05-23 NOTE — Patient Instructions (Signed)

## 2013-05-23 NOTE — Progress Notes (Signed)
Pulse- 107 Patient reports pain & pressure and contractions that come and go; also reports an increase in itching. Reports having headaches now, denies dizziness and only reports spots when going from sitting to standing

## 2013-05-30 ENCOUNTER — Encounter: Payer: Self-pay | Admitting: Family Medicine

## 2013-05-30 ENCOUNTER — Ambulatory Visit (INDEPENDENT_AMBULATORY_CARE_PROVIDER_SITE_OTHER): Payer: Medicaid Other | Admitting: Family Medicine

## 2013-05-30 VITALS — BP 129/85 | Wt 239.7 lb

## 2013-05-30 DIAGNOSIS — Z23 Encounter for immunization: Secondary | ICD-10-CM

## 2013-05-30 DIAGNOSIS — IMO0002 Reserved for concepts with insufficient information to code with codable children: Secondary | ICD-10-CM

## 2013-05-30 DIAGNOSIS — Z3483 Encounter for supervision of other normal pregnancy, third trimester: Secondary | ICD-10-CM

## 2013-05-30 DIAGNOSIS — R6889 Other general symptoms and signs: Secondary | ICD-10-CM

## 2013-05-30 DIAGNOSIS — Z3480 Encounter for supervision of other normal pregnancy, unspecified trimester: Secondary | ICD-10-CM

## 2013-05-30 LAB — POCT URINALYSIS DIP (DEVICE)
Glucose, UA: NEGATIVE mg/dL
Glucose, UA: NEGATIVE mg/dL
Hgb urine dipstick: NEGATIVE
Ketones, ur: NEGATIVE mg/dL
Nitrite: NEGATIVE
Nitrite: NEGATIVE
Protein, ur: 30 mg/dL — AB
Specific Gravity, Urine: >=1.03 (ref 1.005–1.030)
Specific Gravity, Urine: 1.025 (ref 1.005–1.030)
Urobilinogen, UA: 1 mg/dL (ref 0.0–1.0)
pH: 6.5 (ref 5.0–8.0)

## 2013-05-30 MED ORDER — TETANUS-DIPHTH-ACELL PERTUSSIS 5-2.5-18.5 LF-MCG/0.5 IM SUSP
0.5000 mL | Freq: Once | INTRAMUSCULAR | Status: AC
  Administered 2013-05-30: 0.5 mL via INTRAMUSCULAR

## 2013-05-30 NOTE — Progress Notes (Deleted)
UA initiall with blood and 300 protein, repeated pro dec to 30 and blood improved

## 2013-05-30 NOTE — Progress Notes (Signed)
UA initially with blood and 300 protein, repeated pro dec to 30 and blood improved with clean catch. Unknown etiology. Normal BP. No Vision change, headache, RUQ pain or swelling. Continue to monitor at next visit. Return precautions given for PreX

## 2013-05-30 NOTE — Patient Instructions (Signed)

## 2013-05-30 NOTE — Progress Notes (Signed)
Pulse: 94 Pt is having nausea and vomiting this morning and last night. Has irregular contractions.

## 2013-05-30 NOTE — Progress Notes (Signed)
Susan [name] is a 27 y.o. [phone] at [redacted]w[redacted]d here for ROB visit.  Discussed with Patient:  -Plans to breast feed.  All questions answered. -Continue prenatal vitamins. -Reviewed fetal kick counts Pt to perform daily at a time when the baby is active, lie laterally with both hands on belly in quiet room and count all movements (hiccups, shoulder rolls, obvious kicks, etc); pt is to report to clinic L&D for less than 10 movements felt in a one hour time period-pt told as soon as she counts 10 movements the count is complete.  - Routine precautions discussed (depression, infection s/s).   Patient provided with all pertinent phone numbers for emergencies. - RTC for any VB, regular, painful cramps/ctxs occurring at a rate of >2/10 min, fever (100.5 or higher), n/v/d, any pain that is unresolving or worsening, LOF, decreased fetal movement, CP, SOB, edema - RTC in 2 weeks for next appt. - Did GBS swabs today and will f/u results and call if abnormal.  Contact#:  Pt has no drug allergies, so will get PCN if GBS+ OR will get sensitivities on GBS swab as pt is PCN-allergic.  Problems: Patient Active Problem List   Diagnosis Date Noted  . Generalized pruritus 05/01/2013  . Edema or excessive weight gain, antepartum 03/01/2013  . 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 Rubella non-immune status 02/21/2013  . Obesity 02/21/2013  . Supervision of normal subsequent pregnancy 02/21/2013    To Do: 1. Tdap will be given today 2. Labor bag packed  [ ]  Vaccines: Flu:  Tdap: today [ ]  BCM: undecided [ ]  Readiness: baby has a place to sleep, car seat, other baby necessities.  Edu: [x ] PTL precautions; [declined ] BF class; [ ]  childbirth class; [ ]   BF counseling;

## 2013-06-06 ENCOUNTER — Ambulatory Visit (INDEPENDENT_AMBULATORY_CARE_PROVIDER_SITE_OTHER): Payer: Medicaid Other | Admitting: Obstetrics and Gynecology

## 2013-06-06 VITALS — BP 129/81 | Temp 97.5°F | Wt 240.6 lb

## 2013-06-06 DIAGNOSIS — Z3483 Encounter for supervision of other normal pregnancy, third trimester: Secondary | ICD-10-CM

## 2013-06-06 DIAGNOSIS — IMO0002 Reserved for concepts with insufficient information to code with codable children: Secondary | ICD-10-CM

## 2013-06-06 DIAGNOSIS — O0930 Supervision of pregnancy with insufficient antenatal care, unspecified trimester: Secondary | ICD-10-CM

## 2013-06-06 LAB — POCT URINALYSIS DIP (DEVICE)
Ketones, ur: NEGATIVE mg/dL
Protein, ur: 30 mg/dL — AB
Specific Gravity, Urine: 1.02 (ref 1.005–1.030)

## 2013-06-06 NOTE — Patient Instructions (Signed)

## 2013-06-06 NOTE — Progress Notes (Signed)
Pulse- 91   Edema-feet  Pain/pressure-"everywhere"  Vaginal discharge-yellow with mucous Pt reports having itching but does not want to take the vistaril and having headaches

## 2013-06-06 NOTE — Progress Notes (Signed)
Still with some generalized itching (bile acids 9 in July). Try Tylenol for H/A. Stress discussed.

## 2013-06-11 ENCOUNTER — Encounter (HOSPITAL_COMMUNITY): Payer: Self-pay | Admitting: Anesthesiology

## 2013-06-11 ENCOUNTER — Inpatient Hospital Stay (HOSPITAL_COMMUNITY): Payer: Medicaid Other | Admitting: Anesthesiology

## 2013-06-11 ENCOUNTER — Inpatient Hospital Stay (HOSPITAL_COMMUNITY)
Admission: AD | Admit: 2013-06-11 | Discharge: 2013-06-13 | DRG: 775 | Disposition: A | Discharge status: Home / Self Care | Payer: Medicaid Other | Source: Ambulatory Visit | Attending: Family Medicine | Admitting: Family Medicine

## 2013-06-11 ENCOUNTER — Encounter (HOSPITAL_COMMUNITY): Payer: Self-pay | Admitting: *Deleted

## 2013-06-11 DIAGNOSIS — L299 Pruritus, unspecified: Secondary | ICD-10-CM

## 2013-06-11 DIAGNOSIS — IMO0002 Reserved for concepts with insufficient information to code with codable children: Secondary | ICD-10-CM

## 2013-06-11 LAB — CBC
MCV: 86.4 fL (ref 78.0–100.0)
Platelets: 165 10*3/uL (ref 150–400)
RBC: 4.13 MIL/uL (ref 3.87–5.11)
RDW: 12.9 % (ref 11.5–15.5)
WBC: 7.5 10*3/uL (ref 4.0–10.5)

## 2013-06-11 LAB — RPR: RPR Ser Ql: NONREACTIVE

## 2013-06-11 LAB — POCT FERN TEST: POCT Fern Test: POSITIVE

## 2013-06-11 MED ORDER — PHENYLEPHRINE 40 MCG/ML (10ML) SYRINGE FOR IV PUSH (FOR BLOOD PRESSURE SUPPORT)
80.0000 ug | PREFILLED_SYRINGE | INTRAVENOUS | Status: DC | PRN
  Filled 2013-06-11: qty 5

## 2013-06-11 MED ORDER — FENTANYL 2.5 MCG/ML BUPIVACAINE 1/10 % EPIDURAL INFUSION (WH - ANES)
14.0000 mL/h | INTRAMUSCULAR | Status: DC | PRN
  Filled 2013-06-11: qty 125

## 2013-06-11 MED ORDER — PHENYLEPHRINE 40 MCG/ML (10ML) SYRINGE FOR IV PUSH (FOR BLOOD PRESSURE SUPPORT): 80.0000 ug | PREFILLED_SYRINGE | INTRAVENOUS | Status: DC | PRN

## 2013-06-11 MED ORDER — OXYCODONE-ACETAMINOPHEN 5-325 MG PO TABS: 1.0000 | ORAL_TABLET | ORAL | Status: DC | PRN

## 2013-06-11 MED ORDER — LACTATED RINGERS IV SOLN
500.0000 mL | Freq: Once | INTRAVENOUS | Status: AC
  Administered 2013-06-11: 500 mL via INTRAVENOUS

## 2013-06-11 MED ORDER — ZOLPIDEM TARTRATE 5 MG PO TABS: 5.0000 mg | ORAL_TABLET | Freq: Every evening | ORAL | Status: DC | PRN

## 2013-06-11 MED ORDER — ACETAMINOPHEN 325 MG PO TABS: 650.0000 mg | ORAL_TABLET | ORAL | Status: DC | PRN

## 2013-06-11 MED ORDER — MISOPROSTOL 50MCG HALF TABLET
50.0000 ug | ORAL_TABLET | Freq: Once | ORAL | Status: AC
  Administered 2013-06-11: 50 ug via ORAL
  Filled 2013-06-11: qty 1

## 2013-06-11 MED ORDER — IBUPROFEN 600 MG PO TABS: 600.0000 mg | ORAL_TABLET | Freq: Four times a day (QID) | ORAL | Status: DC | PRN

## 2013-06-11 MED ORDER — FENTANYL 2.5 MCG/ML BUPIVACAINE 1/10 % EPIDURAL INFUSION (WH - ANES)
INTRAMUSCULAR | Status: DC | PRN
  Administered 2013-06-11: 14 mL/h via EPIDURAL

## 2013-06-11 MED ORDER — DIPHENHYDRAMINE HCL 50 MG/ML IJ SOLN: 12.5000 mg | INTRAMUSCULAR | Status: DC | PRN

## 2013-06-11 MED ORDER — LANOLIN HYDROUS EX OINT: TOPICAL_OINTMENT | CUTANEOUS | Status: DC | PRN

## 2013-06-11 MED ORDER — WITCH HAZEL-GLYCERIN EX PADS: 1.0000 "application " | MEDICATED_PAD | CUTANEOUS | Status: DC | PRN

## 2013-06-11 MED ORDER — LACTATED RINGERS IV SOLN: 500.0000 mL | INTRAVENOUS | Status: DC | PRN

## 2013-06-11 MED ORDER — OXYCODONE-ACETAMINOPHEN 5-325 MG PO TABS
1.0000 | ORAL_TABLET | ORAL | Status: DC | PRN
  Administered 2013-06-12: 2 via ORAL
  Administered 2013-06-12 (×2): 1 via ORAL
  Filled 2013-06-11: qty 2
  Filled 2013-06-11 (×2): qty 1

## 2013-06-11 MED ORDER — CITRIC ACID-SODIUM CITRATE 334-500 MG/5ML PO SOLN: 30.0000 mL | ORAL | Status: DC | PRN

## 2013-06-11 MED ORDER — ONDANSETRON HCL 4 MG/2ML IJ SOLN: 4.0000 mg | INTRAMUSCULAR | Status: DC | PRN

## 2013-06-11 MED ORDER — TERBUTALINE SULFATE 1 MG/ML IJ SOLN: 0.2500 mg | Freq: Once | INTRAMUSCULAR | Status: DC | PRN

## 2013-06-11 MED ORDER — EPHEDRINE 5 MG/ML INJ: 10.0000 mg | INTRAVENOUS | Status: DC | PRN

## 2013-06-11 MED ORDER — DIBUCAINE 1 % RE OINT: 1.0000 "application " | TOPICAL_OINTMENT | RECTAL | Status: DC | PRN

## 2013-06-11 MED ORDER — BENZOCAINE-MENTHOL 20-0.5 % EX AERO
1.0000 "application " | INHALATION_SPRAY | CUTANEOUS | Status: DC | PRN
  Filled 2013-06-11: qty 56

## 2013-06-11 MED ORDER — OXYTOCIN BOLUS FROM INFUSION: 500.0000 mL | INTRAVENOUS | Status: DC

## 2013-06-11 MED ORDER — ONDANSETRON HCL 4 MG PO TABS: 4.0000 mg | ORAL_TABLET | ORAL | Status: DC | PRN

## 2013-06-11 MED ORDER — EPHEDRINE 5 MG/ML INJ
10.0000 mg | INTRAVENOUS | Status: DC | PRN
  Filled 2013-06-11: qty 4

## 2013-06-11 MED ORDER — OXYTOCIN 40 UNITS IN LACTATED RINGERS INFUSION - SIMPLE MED
1.0000 m[IU]/min | INTRAVENOUS | Status: DC
  Administered 2013-06-11: 2 m[IU]/min via INTRAVENOUS
  Administered 2013-06-11: 4 m[IU]/min via INTRAVENOUS
  Filled 2013-06-11: qty 1000

## 2013-06-11 MED ORDER — OXYTOCIN 40 UNITS IN LACTATED RINGERS INFUSION - SIMPLE MED
62.5000 mL/h | INTRAVENOUS | Status: DC
  Administered 2013-06-11: 999 mL/h via INTRAVENOUS
  Administered 2013-06-11: 62.5 mL/h via INTRAVENOUS
  Filled 2013-06-11: qty 1000

## 2013-06-11 MED ORDER — LACTATED RINGERS IV SOLN
INTRAVENOUS | Status: DC
  Administered 2013-06-11 (×2): via INTRAVENOUS

## 2013-06-11 MED ORDER — ONDANSETRON HCL 4 MG/2ML IJ SOLN
4.0000 mg | Freq: Four times a day (QID) | INTRAMUSCULAR | Status: DC | PRN
  Filled 2013-06-11: qty 2

## 2013-06-11 MED ORDER — LIDOCAINE HCL (PF) 1 % IJ SOLN
INTRAMUSCULAR | Status: DC | PRN
  Administered 2013-06-11 (×2): 9 mL

## 2013-06-11 MED ORDER — TETANUS-DIPHTH-ACELL PERTUSSIS 5-2.5-18.5 LF-MCG/0.5 IM SUSP: 0.5000 mL | Freq: Once | INTRAMUSCULAR | Status: DC

## 2013-06-11 MED ORDER — MISOPROSTOL 25 MCG QUARTER TABLET: 50.0000 ug | ORAL_TABLET | Freq: Once | ORAL | Status: DC

## 2013-06-11 MED ORDER — FLEET ENEMA 7-19 GM/118ML RE ENEM: 1.0000 | ENEMA | RECTAL | Status: DC | PRN

## 2013-06-11 MED ORDER — FENTANYL CITRATE 0.05 MG/ML IJ SOLN
100.0000 ug | INTRAMUSCULAR | Status: DC | PRN
  Administered 2013-06-11: 100 ug via INTRAVENOUS
  Filled 2013-06-11 (×2): qty 2

## 2013-06-11 MED ORDER — DIPHENHYDRAMINE HCL 25 MG PO CAPS: 25.0000 mg | ORAL_CAPSULE | Freq: Four times a day (QID) | ORAL | Status: DC | PRN

## 2013-06-11 MED ORDER — SIMETHICONE 80 MG PO CHEW: 80.0000 mg | CHEWABLE_TABLET | ORAL | Status: DC | PRN

## 2013-06-11 MED ORDER — IBUPROFEN 600 MG PO TABS
600.0000 mg | ORAL_TABLET | Freq: Four times a day (QID) | ORAL | Status: DC
  Administered 2013-06-11 – 2013-06-13 (×6): 600 mg via ORAL
  Filled 2013-06-11 (×7): qty 1

## 2013-06-11 MED ORDER — LIDOCAINE HCL (PF) 1 % IJ SOLN: 30.0000 mL | INTRAMUSCULAR | Status: DC | PRN

## 2013-06-11 MED ORDER — PRENATAL MULTIVITAMIN CH
1.0000 | ORAL_TABLET | Freq: Every day | ORAL | Status: DC
  Administered 2013-06-12: 1 via ORAL
  Filled 2013-06-11 (×2): qty 1

## 2013-06-11 MED ORDER — SENNOSIDES-DOCUSATE SODIUM 8.6-50 MG PO TABS
2.0000 | ORAL_TABLET | Freq: Every day | ORAL | Status: DC
  Administered 2013-06-11 – 2013-06-12 (×2): 2 via ORAL

## 2013-06-11 NOTE — Anesthesia Preprocedure Evaluation (Signed)
Anesthesia Evaluation  Patient identified by MRN, date of birth, ID band Patient awake    Reviewed: Allergy & Precautions, H&P , NPO status , Patient's Chart, lab work & pertinent test results  Airway Mallampati: II TM Distance: >3 FB Neck ROM: full    Dental no notable dental hx.    Pulmonary neg pulmonary ROS,  breath sounds clear to auscultation  Pulmonary exam normal       Cardiovascular negative cardio ROS      Neuro/Psych negative neurological ROS  negative psych ROS   GI/Hepatic negative GI ROS, Neg liver ROS,   Endo/Other  Morbid obesity  Renal/GU negative Renal ROS  negative genitourinary   Musculoskeletal negative musculoskeletal ROS (+)   Abdominal (+) + obese,   Peds  Hematology negative hematology ROS (+)   Anesthesia Other Findings   Reproductive/Obstetrics (+) Pregnancy                           Anesthesia Physical Anesthesia Plan  ASA: III  Anesthesia Plan: Epidural   Post-op Pain Management:    Induction:   Airway Management Planned:   Additional Equipment:   Intra-op Plan:   Post-operative Plan:   Informed Consent: I have reviewed the patients History and Physical, chart, labs and discussed the procedure including the risks, benefits and alternatives for the proposed anesthesia with the patient or authorized representative who has indicated his/her understanding and acceptance.     Plan Discussed with:   Anesthesia Plan Comments:         Anesthesia Quick Evaluation  

## 2013-06-11 NOTE — Anesthesia Procedure Notes (Signed)
Epidural Patient location during procedure: OB Start time: 06/11/2013 3:44 PM End time: 06/11/2013 3:48 PM  Staffing Anesthesiologist: Sandrea Hughs Performed by: anesthesiologist   Preanesthetic Checklist Completed: patient identified, surgical consent, pre-op evaluation, timeout performed, IV checked, risks and benefits discussed and monitors and equipment checked  Epidural Patient position: sitting Prep: site prepped and draped and DuraPrep Patient monitoring: continuous pulse ox and blood pressure Approach: midline Injection technique: LOR air  Needle:  Needle type: Tuohy  Needle gauge: 17 G Needle length: 9 cm and 9 Needle insertion depth: 6 cm Catheter type: closed end flexible Catheter size: 19 Gauge Catheter at skin depth: 12 cm Test dose: negative and Other  Assessment Sensory level: T9 Events: blood not aspirated, injection not painful, no injection resistance, negative IV test and no paresthesia  Additional Notes Reason for block:procedure for pain

## 2013-06-11 NOTE — MAU Note (Signed)
Pt reports leaking fluid since 0200, some contractions

## 2013-06-11 NOTE — H&P (Cosign Needed)
Susan [name] is a 27 y.o. female presenting for SROM at home clear. Pt reports at 0500 she started leaking clear fluid. Denies contractions, VB or decreased fetal movement. Otherwise pt in usual state of health. . Maternal Medical History:  Reason for admission: Nausea.    OB History   Grav Para Term Preterm Abortions TAB SAB Ect Mult Living   5 3 3  0 1 1 0 0 0 3     Past Medical History  Diagnosis Date  . Chlamydia   . Pelvic inflammatory disease (PID)    Past Surgical History  Procedure Laterality Date  . Induced abortion     Family History: family history includes Diabetes in her maternal aunt; Heart disease in her maternal grandmother; Hypertension in her maternal aunt, maternal grandmother, and mother; Lupus in her maternal grandfather and sister; Sickle cell trait in her brother and sister. Social History:  reports that she has never smoked. She has never used smokeless tobacco. She reports that she does not drink alcohol or use illicit drugs.   Prenatal Transfer Tool  Maternal Diabetes: No Genetic Screening: Declined Maternal Ultrasounds/Referrals: Normal Fetal Ultrasounds or other Referrals:  None Maternal Substance Abuse:  No Significant Maternal Medications:  None Significant Maternal Lab Results:  Lab values include: Group B Strep negative Other Comments:  None  Review of Systems  Constitutional: Negative for fever and chills.  Eyes: Negative for double vision.  Respiratory: Negative for cough.   Cardiovascular: Negative for chest pain.  Gastrointestinal: Positive for abdominal pain (rare ctx). Negative for nausea, vomiting and diarrhea.  Genitourinary: Negative for dysuria, urgency, frequency, hematuria and flank pain.  Skin: Negative for rash.    Dilation: 3 Effacement (%): 20;30 Station: -3 Exam by:: Dr Ike Bene Blood pressure 103/81, pulse 115, temperature 98.4 F (36.9 C), temperature source Oral, resp. rate 20, height 5\' 4"  (1.626 m), weight 109.317  kg (241 lb), last menstrual period 09/17/2012, SpO2 99.00%. Exam Physical Exam  Constitutional: She appears well-developed and well-nourished. No distress.  HENT:  Head: Normocephalic and atraumatic.  Eyes: Conjunctivae are normal. Right eye exhibits no discharge. Left eye exhibits no discharge.  Neck: Normal range of motion.  Cardiovascular: Normal rate, regular rhythm, normal heart sounds and intact distal pulses.  Exam reveals no gallop and no friction rub.   No murmur heard. Respiratory: Effort normal and breath sounds normal. No respiratory distress. She has no wheezes. She has no rales. She exhibits no tenderness.  GI: Soft. Bowel sounds are normal. She exhibits no distension (gravid leopold 3000g) and no mass. There is no tenderness. There is no rebound and no guarding.  Genitourinary: Vagina normal.  Skin: Skin is warm and dry. She is not diaphoretic.  Psychiatric: She has a normal mood and affect. Her behavior is normal. Judgment and thought content normal.    FHT 140s mod var mult accel, no decel Toco: none  Prenatal labs: ABO, Rh: B/POS/-- (04/29 1002) Antibody: NEG (04/29 1002) Rubella: 0.69 (04/29 1002) RPR: NON REAC (06/25 1037)  HBsAg: NEGATIVE (04/29 1002)  HIV: NON REACTIVE (06/25 1037)  GBS: Negative (08/06 0000)   Assessment/Plan: Susan [name] is a 27 y.o. [id number] at [redacted]w[redacted]d presents with SROM. Admitted for IOL. LaborAdmitPlan #Labor: IOL will start with PO cytotec to induce labor #Pain: Desire IV pain meds and epidural #FWB: Category I #ID: GBS neg #MOF: breast #MOC: Mirena vs BTL PP  Susan [name] RYAN 06/11/2013, 6:56 AM

## 2013-06-12 LAB — CBC
HCT: 37 % (ref 36.0–46.0)
MCH: 30.4 pg (ref 26.0–34.0)
MCHC: 34.6 g/dL (ref 30.0–36.0)
MCV: 87.9 fL (ref 78.0–100.0)
RDW: 13.2 % (ref 11.5–15.5)

## 2013-06-12 NOTE — Lactation Note (Signed)
This note was copied from the chart of Susan Margreta Journey. Lactation Consultation Note: Initial visit with mom. This is her 4 th baby and she has BF her other babies about 3 Tyeler Goedken each. Has already offered formula.Encouraged to BF as much as possible to promote a good milk supply. Requested manual pump- given with instructions for use and cleaning. BF brochure given with resources for support after DC. No questions at present. To call for assist prn  Patient Name: Susan [name] [id number] Date: 06/12/2013 Reason for consult: Initial assessment   Maternal Data Formula Feeding for Exclusion: No Infant to breast within first hour of birth: Yes Does the patient have breastfeeding experience prior to this delivery?: Yes  Feeding Feeding Type: Breast Milk  LATCH Score/Interventions Latch: Too sleepy or reluctant, no latch achieved, no sucking elicited.  Audible Swallowing: None  Type of Nipple: Everted at rest and after stimulation  Comfort (Breast/Nipple): Soft / non-tender     Hold (Positioning): Assistance needed to correctly position infant at breast and maintain latch. Intervention(s): Breastfeeding basics reviewed;Support Pillows  LATCH Score: 5  Lactation Tools Discussed/Used     Consult Status Consult Status: Follow-up Date: 06/13/13 Follow-up type: In-patient    Pamelia Hoit 06/12/2013, 10:28 AM

## 2013-06-12 NOTE — Anesthesia Postprocedure Evaluation (Signed)
Anesthesia Post Note  Patient: Susan [name]  Procedure(s) Performed: * No procedures listed *  Anesthesia type: Epidural  Patient location: Mother/Baby  Post pain: Pain level controlled  Post assessment: Post-op Vital signs reviewed  Last Vitals:  Filed Vitals:   06/12/13 0530  BP: 115/72  Pulse: 83  Temp: 37.2 C  Resp: 18    Post vital signs: Reviewed  Level of consciousness:alert  Complications: No apparent anesthesia complications

## 2013-06-12 NOTE — Progress Notes (Signed)
Post Partum Day 1 Subjective: no complaints, up ad lib, voiding and tolerating PO  Objective: Blood pressure 115/72, pulse 83, temperature 98.9 F (37.2 C), temperature source Oral, resp. rate 18, height 5\' 4"  (1.626 m), weight 109.317 kg (241 lb), last menstrual period 09/17/2012, SpO2 97.00%, unknown if currently breastfeeding.  Physical Exam:  General: alert, cooperative and no distress Lochia: appropriate Uterine Fundus: firm Incision: n/a DVT Evaluation: No evidence of DVT seen on physical exam. Negative Homan's sign. No cords or calf tenderness. No significant calf/ankle edema.   Recent Labs  06/11/13 0820 06/12/13 0555  HGB 12.5 12.8  HCT 35.7* 37.0    Assessment/Plan: Plan for discharge tomorrow, Breastfeeding and Contraception undecided   LOS: 1 day   Jacquelin Hawking, MD 06/12/2013, 7:53 AM   Evaluation and management procedures were performed by Resident physician under my supervision/collaboration. Chart reviewed, patient examined by me and I agree with management and plan.

## 2013-06-12 NOTE — Progress Notes (Signed)
UR chart review completed.  

## 2013-06-13 ENCOUNTER — Encounter: Payer: Self-pay | Admitting: Family Medicine

## 2013-06-13 MED ORDER — MEASLES, MUMPS & RUBELLA VAC ~~LOC~~ INJ
0.5000 mL | INJECTION | Freq: Once | SUBCUTANEOUS | Status: AC
  Administered 2013-06-13: 0.5 mL via SUBCUTANEOUS
  Filled 2013-06-13: qty 0.5

## 2013-06-13 MED ORDER — IBUPROFEN 600 MG PO TABS: 600.0000 mg | ORAL_TABLET | Freq: Four times a day (QID) | ORAL | Status: DC

## 2013-06-13 NOTE — Discharge Summary (Signed)
Obstetric Discharge Summary Reason for Admission: onset of labor Prenatal Procedures: none Intrapartum Procedures: spontaneous vaginal delivery Postpartum Procedures: none Complications-Operative and Postpartum: none Hemoglobin  Date Value Range Status  06/12/2013 12.8  12.0 - 15.0 g/dL Final     HCT  Date Value Range Status  06/12/2013 37.0  36.0 - 46.0 % Final    Physical Exam:  General: alert, cooperative and no distress Lochia: appropriate Uterine Fundus: firm Incision: na DVT Evaluation: No evidence of DVT seen on physical exam.  Discharge Diagnoses: Term Pregnancy-delivered  Discharge Information: Date: 06/13/2013 Activity: unrestricted Diet: routine Medications: Ibuprofen Condition: stable Instructions: refer to practice specific booklet Discharge to: home   Newborn Data: Live born female  Birth Weight: 6 lb 5.9 oz (2890 g) APGAR: 9, 9  Home with mother.  Susan [name] 06/13/2013, 7:50 AM

## 2013-06-20 ENCOUNTER — Encounter: Payer: Self-pay | Admitting: *Deleted

## 2013-06-20 NOTE — Discharge Summary (Signed)
Attestation of Attending Supervision of Advanced Practitioner (CNM/NP): Evaluation and management procedures were performed by the Advanced Practitioner under my supervision and collaboration. I have reviewed the Advanced Practitioner's note and chart, and I agree with the management and plan.  Huxley Vanwagoner H. 11:44 AM   

## 2013-07-04 ENCOUNTER — Encounter: Payer: Self-pay | Admitting: *Deleted

## 2013-07-26 ENCOUNTER — Ambulatory Visit (INDEPENDENT_AMBULATORY_CARE_PROVIDER_SITE_OTHER): Payer: Medicaid Other | Admitting: Obstetrics & Gynecology

## 2013-07-26 ENCOUNTER — Encounter: Payer: Self-pay | Admitting: Obstetrics & Gynecology

## 2013-07-26 DIAGNOSIS — Z309 Encounter for contraceptive management, unspecified: Secondary | ICD-10-CM

## 2013-07-26 MED ORDER — NORGESTIMATE-ETH ESTRADIOL 0.25-35 MG-MCG PO TABS: 1.0000 | ORAL_TABLET | Freq: Every day | ORAL | Status: DC

## 2013-07-26 NOTE — Patient Instructions (Signed)
Contraception Choices  Contraception (birth control) is the use of any methods or devices to prevent pregnancy. Below are some methods to help avoid pregnancy.  HORMONAL METHODS   · Contraceptive implant. This is a thin, plastic tube containing progesterone hormone. It does not contain estrogen hormone. Your caregiver inserts the tube in the inner part of the upper arm. The tube can remain in place for up to 3 years. After 3 years, the implant must be removed. The implant prevents the ovaries from releasing an egg (ovulation), thickens the cervical mucus which prevents sperm from entering the uterus, and thins the lining of the inside of the uterus.  · Progesterone-only injections. These injections are given every 3 months by your caregiver to prevent pregnancy. This synthetic progesterone hormone stops the ovaries from releasing eggs. It also thickens cervical mucus and changes the uterine lining. This makes it harder for sperm to survive in the uterus.  · Birth control pills. These pills contain estrogen and progesterone hormone. They work by stopping the egg from forming in the ovary (ovulation). Birth control pills are prescribed by a caregiver. Birth control pills can also be used to treat heavy periods.  · Minipill. This type of birth control pill contains only the progesterone hormone. They are taken every day of each month and must be prescribed by your caregiver.  · Birth control patch. The patch contains hormones similar to those in birth control pills. It must be changed once a week and is prescribed by a caregiver.  · Vaginal ring. The ring contains hormones similar to those in birth control pills. It is left in the vagina for 3 weeks, removed for 1 week, and then a new one is put back in place. The patient must be comfortable inserting and removing the ring from the vagina. A caregiver's prescription is necessary.  · Emergency contraception. Emergency contraceptives prevent pregnancy after unprotected  sexual intercourse. This pill can be taken right after sex or up to 5 days after unprotected sex. It is most effective the sooner you take the pills after having sexual intercourse. Emergency contraceptive pills are available without a prescription. Check with your pharmacist. Do not use emergency contraception as your only form of birth control.  BARRIER METHODS   · Female condom. This is a thin sheath (latex or rubber) that is worn over the penis during sexual intercourse. It can be used with spermicide to increase effectiveness.  · Female condom. This is a soft, loose-fitting sheath that is put into the vagina before sexual intercourse.  · Diaphragm. This is a soft, latex, dome-shaped barrier that must be fitted by a caregiver. It is inserted into the vagina, along with a spermicidal jelly. It is inserted before intercourse. The diaphragm should be left in the vagina for 6 to 8 hours after intercourse.  · Cervical cap. This is a round, soft, latex or plastic cup that fits over the cervix and must be fitted by a caregiver. The cap can be left in place for up to 48 hours after intercourse.  · Sponge. This is a soft, circular piece of polyurethane foam. The sponge has spermicide in it. It is inserted into the vagina after wetting it and before sexual intercourse.  · Spermicides. These are chemicals that kill or block sperm from entering the cervix and uterus. They come in the form of creams, jellies, suppositories, foam, or tablets. They do not require a prescription. They are inserted into the vagina with an applicator before having sexual intercourse.   The process must be repeated every time you have sexual intercourse.  INTRAUTERINE CONTRACEPTION  · Intrauterine device (IUD). This is a T-shaped device that is put in a woman's uterus during a menstrual period to prevent pregnancy. There are 2 types:  · Copper IUD. This type of IUD is wrapped in copper wire and is placed inside the uterus. Copper makes the uterus and  fallopian tubes produce a fluid that kills sperm. It can stay in place for 10 years.  · Hormone IUD. This type of IUD contains the hormone progestin (synthetic progesterone). The hormone thickens the cervical mucus and prevents sperm from entering the uterus, and it also thins the uterine lining to prevent implantation of a fertilized egg. The hormone can weaken or kill the sperm that get into the uterus. It can stay in place for 5 years.  PERMANENT METHODS OF CONTRACEPTION  · Female tubal ligation. This is when the woman's fallopian tubes are surgically sealed, tied, or blocked to prevent the egg from traveling to the uterus.  · Female sterilization. This is when the female has the tubes that carry sperm tied off (vasectomy). This blocks sperm from entering the vagina during sexual intercourse. After the procedure, the man can still ejaculate fluid (semen).  NATURAL PLANNING METHODS  · Natural family planning. This is not having sexual intercourse or using a barrier method (condom, diaphragm, cervical cap) on days the woman could become pregnant.  · Calendar method. This is keeping track of the length of each menstrual cycle and identifying when you are fertile.  · Ovulation method. This is avoiding sexual intercourse during ovulation.  · Symptothermal method. This is avoiding sexual intercourse during ovulation, using a thermometer and ovulation symptoms.  · Post-ovulation method. This is timing sexual intercourse after you have ovulated.  Regardless of which type or method of contraception you choose, it is important that you use condoms to protect against the transmission of sexually transmitted diseases (STDs). Talk with your caregiver about which form of contraception is most appropriate for you.  Document Released: 10/04/2005 Document Revised: 12/27/2011 Document Reviewed: 02/10/2011  [employer]® Patient Information ©2014 [employer].

## 2013-07-26 NOTE — Progress Notes (Signed)
Patient ID: Susan [name], female   DOB: Jun 14, 1986, 27 y.o.   MRN: [id number] Subjective:     Susan [name] is a 27 y.o. female who presents for a postpartum visit. She is 7 weeks postpartum following a spontaneous vaginal delivery. I have fully reviewed the prenatal and intrapartum course. The delivery was at term. Outcome: spontaneous vaginal delivery. Postpartum course has been uncomplicated. Baby's course has been uncomplicated.  Bleeding no bleeding. Bowel function is normal. Bladder function is normal.  Contraception method is condoms. Postpartum depression screening: negative.  The following portions of the patient's history were reviewed and updated as appropriate: allergies, current medications, past family history, past medical history, past social history, past surgical history and problem list.  Review of Systems Pertinent items are noted in HPI.   Objective:    BP 149/82  Pulse 55  Temp(Src) 97.5 F (36.4 C) (Oral)  Ht 5\' 4"  (1.626 m)  Wt 222 lb 14.4 oz (101.107 kg)  BMI 38.24 kg/m2  LMP 07/22/2013  Breastfeeding? No  Exam deferred                                       Assessment:    **7 weeks ostpartum exam. Pap smear not done at today's visit.   Plan:    1. Contraception: condoms 2. D/w pt options of contraception.  Pt want Mirena,  Will start OCP's and go to the HD for Mirena  3. Follow up in: 1 year or sooner prn or as needed.  4. Sprintec start in 3 days.

## 2014-01-21 ENCOUNTER — Emergency Department (HOSPITAL_COMMUNITY)
Admission: EM | Admit: 2014-01-21 | Discharge: 2014-01-21 | Disposition: A | Discharge status: Home / Self Care | Payer: Medicaid Other | Attending: Emergency Medicine | Admitting: Emergency Medicine

## 2014-01-21 ENCOUNTER — Encounter (HOSPITAL_COMMUNITY): Payer: Self-pay | Admitting: Emergency Medicine

## 2014-01-21 DIAGNOSIS — Z8742 Personal history of other diseases of the female genital tract: Secondary | ICD-10-CM | POA: Insufficient documentation

## 2014-01-21 DIAGNOSIS — Z8619 Personal history of other infectious and parasitic diseases: Secondary | ICD-10-CM | POA: Insufficient documentation

## 2014-01-21 DIAGNOSIS — M545 Low back pain, unspecified: Secondary | ICD-10-CM | POA: Insufficient documentation

## 2014-01-21 DIAGNOSIS — R209 Unspecified disturbances of skin sensation: Secondary | ICD-10-CM | POA: Insufficient documentation

## 2014-01-21 DIAGNOSIS — Z79899 Other long term (current) drug therapy: Secondary | ICD-10-CM | POA: Insufficient documentation

## 2014-01-21 MED ORDER — IBUPROFEN 800 MG PO TABS: 800.0000 mg | ORAL_TABLET | Freq: Three times a day (TID) | ORAL | Status: DC | PRN

## 2014-01-21 MED ORDER — CYCLOBENZAPRINE HCL 10 MG PO TABS: 10.0000 mg | ORAL_TABLET | Freq: Three times a day (TID) | ORAL | Status: DC | PRN

## 2014-01-21 NOTE — Discharge Instructions (Signed)
Read the information below.  Use the prescribed medication as directed.  Please discuss all new medications with your pharmacist.  You may return to the Emergency Department at any time for worsening condition or any new symptoms that concern you.  If there is any possibility that you might be pregnant, please let your health care provider know and discuss this with the pharmacist to ensure medication safety.   If you develop fevers, loss of control of bowel or bladder, weakness or numbness in your legs, or are unable to walk, return to the ER for a recheck.    Back Injury Prevention Back injuries can be extremely painful and difficult to heal. After having one back injury, you are much more likely to experience another later on. It is important to learn how to avoid injuring or re-injuring your back. The following tips can help you to prevent a back injury. PHYSICAL FITNESS  Exercise regularly and try to develop good tone in your abdominal muscles. Your abdominal muscles provide a lot of the support needed by your back.  Do aerobic exercises (walking, jogging, biking, swimming) regularly.  Do exercises that increase balance and strength (tai chi, yoga) regularly. This can decrease your risk of falling and injuring your back.  Stretch before and after exercising.  Maintain a healthy weight. The more you weigh, the more stress is placed on your back. For every pound of weight, 10 times that amount of pressure is placed on the back. DIET  Talk to your caregiver about how much calcium and vitamin D you need per day. These nutrients help to prevent weakening of the bones (osteoporosis). Osteoporosis can cause broken (fractured) bones that lead to back pain.  Include good sources of calcium in your diet, such as dairy products, green, leafy vegetables, and products with calcium added (fortified).  Include good sources of vitamin D in your diet, such as milk and foods that are fortified with vitamin  D.  Consider taking a nutritional supplement or a multivitamin if needed.  Stop smoking if you smoke. POSTURE  Sit and stand up straight. Avoid leaning forward when you sit or hunching over when you stand.  Choose chairs with good low back (lumbar) support.  If you work at a desk, sit close to your work so you do not need to lean over. Keep your chin tucked in. Keep your neck drawn back and elbows bent at a right angle. Your arms should look like the letter "L."  Sit high and close to the steering wheel when you drive. Add a lumbar support to your car seat if needed.  Avoid sitting or standing in one position for too long. Take breaks to get up, stretch, and walk around at least once every hour. Take breaks if you are driving for long periods of time.  Sleep on your side with your knees slightly bent, or sleep on your back with a pillow under your knees. Do not sleep on your stomach. LIFTING, TWISTING, AND REACHING  Avoid heavy lifting, especially repetitive lifting. If you must do heavy lifting:  Stretch before lifting.  Work slowly.  Rest between lifts.  Use carts and dollies to move objects when possible.  Make several small trips instead of carrying 1 heavy load.  Ask for help when you need it.  Ask for help when moving big, awkward objects.  Follow these steps when lifting:  Stand with your feet shoulder-width apart.  Get as close to the object as you can.  Do not try to pick up heavy objects that are far from your body.  Use handles or lifting straps if they are available.  Bend at your knees. Squat down, but keep your heels off the floor.  Keep your shoulders pulled back, your chin tucked in, and your back straight.  Lift the object slowly, tightening the muscles in your legs, abdomen, and buttocks. Keep the object as close to the center of your body as possible.  When you put a load down, use these same guidelines in reverse.  Do not:  Lift the object  above your waist.  Twist at the waist while lifting or carrying a load. Move your feet if you need to turn, not your waist.  Bend over without bending at your knees.  Avoid reaching over your head, across a table, or for an object on a high surface. OTHER TIPS  Avoid wet floors and keep sidewalks clear of ice to prevent falls.  Do not sleep on a mattress that is too soft or too hard.  Keep items that are used frequently within easy reach.  Put heavier objects on shelves at waist level and lighter objects on lower or higher shelves.  Find ways to decrease your stress, such as exercise, massage, or relaxation techniques. Stress can build up in your muscles. Tense muscles are more vulnerable to injury.  Seek treatment for depression or anxiety if needed. These conditions can increase your risk of developing back pain. SEEK MEDICAL CARE IF:  You injure your back.  You have questions about diet, exercise, or other ways to prevent back injuries. MAKE SURE YOU:  Understand these instructions.  Will watch your condition.  Will get help right away if you are not doing well or get worse. Document Released: 11/11/2004 Document Revised: 12/27/2011 Document Reviewed: 11/15/2011 [facility] Patient Information 2014 [place], [place].  Back Pain, Adult Low back pain is very common. About 1 in 5 people have back pain.The cause of low back pain is rarely dangerous. The pain often gets better over time.About half of people with a sudden onset of back pain feel better in just 2 weeks. About 8 in 10 people feel better by 6 weeks.  CAUSES Some common causes of back pain include:  Strain of the muscles or ligaments supporting the spine.  Wear and tear (degeneration) of the spinal discs.  Arthritis.  Direct injury to the back. DIAGNOSIS Most of the time, the direct cause of low back pain is not known.However, back pain can be treated effectively even when the exact cause of the pain is  unknown.Answering your caregiver's questions about your overall health and symptoms is one of the most accurate ways to make sure the cause of your pain is not dangerous. If your caregiver needs more information, he or she may order lab work or imaging tests (X-rays or MRIs).However, even if imaging tests show changes in your back, this usually does not require surgery. HOME CARE INSTRUCTIONS For many people, back pain returns.Since low back pain is rarely dangerous, it is often a condition that people can learn to [facility] their own.   Remain active. It is stressful on the back to sit or stand in one place. Do not sit, drive, or stand in one place for more than 30 minutes at a time. Take short walks on level surfaces as soon as pain allows.Try to increase the length of time you walk each day.  Do not stay in bed.Resting more than 1 or 2  days can delay your recovery.  Do not avoid exercise or work.Your body is made to move.It is not dangerous to be active, even though your back may hurt.Your back will likely heal faster if you return to being active before your pain is gone.  Pay attention to your body when you bend and lift. Many people have less discomfortwhen lifting if they bend their knees, keep the load close to their bodies,and avoid twisting. Often, the most comfortable positions are those that put less stress on your recovering back.  Find a comfortable position to sleep. Use a firm mattress and lie on your side with your knees slightly bent. If you lie on your back, put a pillow under your knees.  Only take over-the-counter or prescription medicines as directed by your caregiver. Over-the-counter medicines to reduce pain and inflammation are often the most helpful.Your caregiver may prescribe muscle relaxant drugs.These medicines help dull your pain so you can more quickly return to your normal activities and healthy exercise.  Put ice on the injured area.  Put ice in a  plastic bag.  Place a towel between your skin and the bag.  Leave the ice on for 15-20 minutes, 03-04 times a day for the first 2 to 3 days. After that, ice and heat may be alternated to reduce pain and spasms.  Ask your caregiver about trying back exercises and gentle massage. This may be of some benefit.  Avoid feeling anxious or stressed.Stress increases muscle tension and can worsen back pain.It is important to recognize when you are anxious or stressed and learn ways to manage it.Exercise is a great option. SEEK MEDICAL CARE IF:  You have pain that is not relieved with rest or medicine.  You have pain that does not improve in 1 week.  You have new symptoms.  You are generally not feeling well. SEEK IMMEDIATE MEDICAL CARE IF:   You have pain that radiates from your back into your legs.  You develop new bowel or bladder control problems.  You have unusual weakness or numbness in your arms or legs.  You develop nausea or vomiting.  You develop abdominal pain.  You feel faint. Document Released: 10/04/2005 Document Revised: 04/04/2012 Document Reviewed: 02/22/2011 [facility] Patient Information 2014 [place], [place].

## 2014-01-21 NOTE — ED Notes (Signed)
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 States she doesn't pick up anything heavy or no known injury just hurts from standing all day.

## 2014-01-21 NOTE — ED Provider Notes (Signed)
CSN: [id number]     Arrival date & time 01/21/14  1049 History  This chart was scribed for non-physician practitioner, Trixie Dredge, PA-C working with Lyanne Co, MD by Greggory Stallion, ED scribe. This patient was seen in room TR11C/TR11C and the patient's care was started at 12:47 PM.   Chief Complaint  Patient presents with  . Back Pain   The history is provided by the patient. No language interpreter was used.   HPI Comments: Susan [name] is a 28 y.o. female who presents to the Emergency Department complaining of gradual onset, constant, aching, sharp lower back pain that started one week ago. She states she has been having intermittent back pain for several months now. 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 The pain is worse while she is at work. Pt has taken 800 mg ibuprofen with no relief. States she has had two episodes of left leg tingling but this resolved with a change in position. Denies abdominal pain, diarrhea, hematochezia, dysuria, hematuria, urgency, frequency, vaginal discharge, vaginal bleeding, bowel or bladder incontinence. LMP 3/26.   Past Medical History  Diagnosis Date  . Chlamydia   . Pelvic inflammatory disease (PID)    Past Surgical History  Procedure Laterality Date  . Induced abortion     Family History  Problem Relation Age of Onset  . Hypertension Mother   . Lupus Sister   . Sickle cell trait Sister   . Sickle cell trait Brother   . Hypertension Maternal Grandmother   . Heart disease Maternal Grandmother   . Lupus Maternal Grandfather   . Diabetes Maternal Aunt   . Hypertension Maternal Aunt    History  Substance Use Topics  . Smoking status: Never Smoker   . Smokeless tobacco: Never Used  . Alcohol Use: No   OB History   Grav Para Term Preterm Abortions TAB SAB Ect Mult Living   5 4 4  0 1 1 0 0 0 4     Review of Systems  Gastrointestinal: Negative for  abdominal pain, diarrhea and blood in stool.  Genitourinary: Negative for dysuria, urgency, frequency, hematuria, vaginal bleeding and vaginal discharge.       Negative for bowel or bladder incontinence.   Musculoskeletal: Positive for back pain.  Neurological: Positive for numbness.  All other systems reviewed and are negative.   Allergies  Review of patient's allergies indicates no known allergies.  Home Medications   Current Outpatient Rx  Name  Route  Sig  Dispense  Refill  . hydrOXYzine (VISTARIL) 25 MG capsule   Oral   Take 1 capsule (25 mg total) by mouth 3 (three) times daily as needed for itching.   30 capsule   0   . ibuprofen (ADVIL,MOTRIN) 600 MG tablet   Oral   Take 1 tablet (600 mg total) by mouth every 6 (six) hours.   60 tablet   0   . norgestimate-ethinyl estradiol (ORTHO-CYCLEN,SPRINTEC,PREVIFEM) 0.25-35 MG-MCG tablet   Oral   Take 1 tablet by mouth daily.   1 Package   11   . Prenatal Vit-Fe Fumarate-FA (PRENATAL MULTIVITAMIN) TABS   Oral   Take 1 tablet by mouth daily at 12 noon.          BP 123/85  Pulse 83  Temp(Src) 97.8 F (36.6 C) (Oral)  Resp 18  Ht 5\' 4"  (1.626 m)  Wt 205 lb 6.4 oz (93.169 kg)  BMI 35.24 kg/m2  SpO2 97%  LMP 01/11/2014  Physical Exam  Nursing note and vitals reviewed. Constitutional: She appears well-developed and well-nourished. No distress.  HENT:  Head: Normocephalic and atraumatic.  Neck: Neck supple.  Pulmonary/Chest: Effort normal.  Abdominal: Soft. She exhibits no mass. There is no tenderness. There is no rebound and no guarding.  Musculoskeletal:  Spine nontender, no crepitus, or stepoffs. Lower extremities:  Strength 5/5, sensation intact, distal pulses intact. Lumbar paraspinal tenderness.   Neurological: She is alert.  Skin: She is not diaphoretic.    ED Course  Procedures (including critical care time)  DIAGNOSTIC STUDIES: Oxygen Saturation is 97% on RA, normal by my interpretation.     COORDINATION OF CARE: 12:52 PM-Discussed treatment plan which includes an anti-inflammatory with pt at bedside and pt agreed to plan. Return precautions given. Will give pt PCP referrals and advised her to follow up.  Labs Review Labs Reviewed - No data to display Imaging Review No results found.   EKG Interpretation None      MDM   Final diagnoses:  Low back pain    Patient with gradually worsening low back pain times several months. Stands for long time on a cement floor at work with steel toed boots.  No heavy lifting (though patient does have an 518 month old daughter whom she denies carrying or lifting regularly).  No red flags for back pain.  D/C home with flexeril, ibuprofen, PCP follow up.  Discussed result, findings, treatment, and follow up  with patient.  Pt given return precautions.  Pt verbalizes understanding and agrees with plan.       I personally performed the services described in this documentation, which was scribed in my presence. The recorded information has been reviewed and is accurate.  Trixie Dredgemily Heleena Miceli, PA-C 01/21/14 1636

## 2014-01-21 NOTE — ED Notes (Signed)
Patient states she didn't try anything for pain at home.   "I figured it wouldn't work, so I just came here to see if I sprained it".  Patient denies injury.

## 2014-01-22 NOTE — ED Provider Notes (Signed)
Medical screening examination/treatment/procedure(s) were performed by non-physician practitioner and as supervising physician I was immediately available for consultation/collaboration.   EKG Interpretation None        Domenique Southers M Lindzie Boxx, MD 01/22/14 0739 

## 2014-02-03 ENCOUNTER — Encounter (HOSPITAL_COMMUNITY): Payer: Self-pay | Admitting: Emergency Medicine

## 2014-02-03 ENCOUNTER — Emergency Department (HOSPITAL_COMMUNITY)
Admission: EM | Admit: 2014-02-03 | Discharge: 2014-02-03 | Disposition: A | Discharge status: Home / Self Care | Payer: Medicaid Other | Attending: Emergency Medicine | Admitting: Emergency Medicine

## 2014-02-03 DIAGNOSIS — L0291 Cutaneous abscess, unspecified: Secondary | ICD-10-CM

## 2014-02-03 DIAGNOSIS — L0390 Cellulitis, unspecified: Secondary | ICD-10-CM

## 2014-02-03 DIAGNOSIS — Z8742 Personal history of other diseases of the female genital tract: Secondary | ICD-10-CM | POA: Insufficient documentation

## 2014-02-03 DIAGNOSIS — Z8619 Personal history of other infectious and parasitic diseases: Secondary | ICD-10-CM | POA: Insufficient documentation

## 2014-02-03 DIAGNOSIS — IMO0002 Reserved for concepts with insufficient information to code with codable children: Secondary | ICD-10-CM | POA: Insufficient documentation

## 2014-02-03 MED ORDER — CLINDAMYCIN HCL 150 MG PO CAPS: 300.0000 mg | ORAL_CAPSULE | Freq: Three times a day (TID) | ORAL | Status: DC

## 2014-02-03 MED ORDER — OXYCODONE-ACETAMINOPHEN 5-325 MG PO TABS
2.0000 | ORAL_TABLET | Freq: Once | ORAL | Status: AC
  Administered 2014-02-03: 2 via ORAL
  Filled 2014-02-03: qty 2

## 2014-02-03 MED ORDER — HYDROCODONE-ACETAMINOPHEN 5-325 MG PO TABS: 2.0000 | ORAL_TABLET | ORAL | Status: DC | PRN

## 2014-02-03 NOTE — ED Provider Notes (Signed)
Medical screening examination/treatment/procedure(s) were performed by non-physician practitioner and as supervising physician I was immediately available for consultation/collaboration.   EKG Interpretation None        William Haneefah Venturini, MD 02/03/14 1126 

## 2014-02-03 NOTE — ED Provider Notes (Signed)
CSN: [id number]     Arrival date & time 02/03/14  0820 History  This chart was scribed for non-physician practitioner Emilia BeckKaitlyn Jachelle Fluty, working with Dagmar HaitWilliam Blair Walden, MD by Carl Bestelina Holson, ED Scribe. This patient was seen in room TR08C/TR08C and the patient's care was started at 9:05 AM.    Chief Complaint  Patient presents with  . Abscess    Patient is a 28 y.o. female presenting with abscess. The history is provided by the patient. No language interpreter was used.  Abscess Associated symptoms: no fever    HPI Comments: Susan [name] is a 28 y.o. female who presents to the Emergency Department complaining of an abscess under her left axilla that she noticed a week ago.  The patient denies fever as an associated symptom but states that she felt as though she was going to "pass out" the other day.  She states that there has been no drainage from the area.  She denies having a history of DM.     Past Medical History  Diagnosis Date  . Chlamydia   . Pelvic inflammatory disease (PID)    Past Surgical History  Procedure Laterality Date  . Induced abortion     Family History  Problem Relation Age of Onset  . Hypertension Mother   . Lupus Sister   . Sickle cell trait Sister   . Sickle cell trait Brother   . Hypertension Maternal Grandmother   . Heart disease Maternal Grandmother   . Lupus Maternal Grandfather   . Diabetes Maternal Aunt   . Hypertension Maternal Aunt    History  Substance Use Topics  . Smoking status: Never Smoker   . Smokeless tobacco: Never Used  . Alcohol Use: No   OB History   Grav Para Term Preterm Abortions TAB SAB Ect Mult Living   5 4 4  0 1 1 0 0 0 4     Review of Systems  Constitutional: Negative for fever.  Skin: Positive for wound.  All other systems reviewed and are negative.     Allergies  Review of patient's allergies indicates no known allergies.  Home Medications   Prior to Admission medications   Medication Sig Start Date  End Date Taking? Authorizing Provider  cyclobenzaprine (FLEXERIL) 10 MG tablet Take 1 tablet (10 mg total) by mouth 3 (three) times daily as needed for muscle spasms. 01/21/14   Trixie DredgeEmily West, PA-C  ibuprofen (ADVIL,MOTRIN) 800 MG tablet Take 1 tablet (800 mg total) by mouth every 8 (eight) hours as needed for mild pain or moderate pain. 01/21/14   Trixie DredgeEmily West, PA-C   Triage Vitals: BP 122/72  Pulse 87  Temp(Src) 98.6 F (37 C) (Oral)  Resp 20  Ht 5\' 4"  (1.626 m)  Wt 195 lb (88.451 kg)  BMI 33.46 kg/m2  SpO2 100%  LMP 01/11/2014  Physical Exam  Nursing note and vitals reviewed. Constitutional: She is oriented to person, place, and time. She appears well-developed and well-nourished. No distress.  HENT:  Head: Normocephalic and atraumatic.  Eyes: Conjunctivae and EOM are normal.  Neck: Normal range of motion.  Cardiovascular: Normal rate and regular rhythm.  Exam reveals no gallop and no friction rub.   No murmur heard. Pulmonary/Chest: Effort normal and breath sounds normal. She has no wheezes. She has no rales. She exhibits no tenderness.  Musculoskeletal: Normal range of motion.  Neurological: She is alert and oriented to person, place, and time.  Speech is goal-oriented. Moves limbs without ataxia.  Skin: Skin is warm and dry.  Fluctuant mass at left axilla that is tender to palpation. No drainage noted.   Psychiatric: She has a normal mood and affect. Her behavior is normal.    ED Course  Procedures (including critical care time)  INCISION AND DRAINAGE Performed by: Emilia BeckKaitlyn Aleksandra Raben Consent: Verbal consent obtained. Risks and benefits: risks, benefits and alternatives were discussed Type: abscess  Body area: left axilla  Anesthesia: local infiltration  Incision was made with a scalpel.  Local anesthetic: topical spray   Complexity: complex Blunt dissection to break up loculations  Drainage: purulent  Drainage amount: 15 mL  Packing material: none  Patient  tolerance: Patient tolerated the procedure well with no immediate complications.     DIAGNOSTIC STUDIES: Oxygen Saturation is 100% on room air, normal by my interpretation.    COORDINATION OF CARE: 9:06 AM- Discussed performing an I&D of the patient's abscess.  The patient agreed to the treatment plan.   Labs Review Labs Reviewed - No data to display  Imaging Review No results found.   EKG Interpretation None      MDM   Final diagnoses:  Abscess and cellulitis    10:01 AM Abscess drained without complications. Vitals stable and patient afebrile. Patient will have antibiotics and pain medication on discharged. Patient will have Clindamycin and Vicodin.   I personally performed the services described in this documentation, which was scribed in my presence. The recorded information has been reviewed and is accurate.     Emilia BeckKaitlyn Rowynn Mcweeney, PA-C 02/03/14 1005

## 2014-02-03 NOTE — ED Notes (Signed)
Pt presents to department for evaluation of abscess under L axilla. Ongoing x1 weeks. States pain and swelling became worse this morning. No drainage noted. 9/10 pain upon arrival. No signs of distress noted.

## 2014-02-03 NOTE — Discharge Instructions (Signed)
Take Clindamycin as directed until gone. Take Vicodin as needed for pain. Refer to attached documents for more information. Return to the ED with worsening or concerning symptoms.  °

## 2014-02-05 LAB — WOUND CULTURE: SPECIAL REQUESTS: NORMAL

## 2014-02-06 ENCOUNTER — Telehealth (HOSPITAL_BASED_OUTPATIENT_CLINIC_OR_DEPARTMENT_OTHER): Payer: Self-pay | Admitting: Emergency Medicine

## 2014-02-06 NOTE — Telephone Encounter (Signed)
Post ED Visit - Positive Culture Follow-up: Successful Patient Follow-Up  Culture assessed and recommendations reviewed by: []  Wes Dulaney, Pharm.D., BCPS []  Celedonio MiyamotoJeremy Frens, Pharm.D., BCPS []  Georgina PillionElizabeth Martin, Pharm.D., BCPS []  [place] ParkMinh Pham, [address] BoulevardPharm.D., BCPS, AAHIVP []  Estella HuskMichelle Turner, Pharm.D., BCPS, AAHIVP [x]  Harvie JuniorNathan Cope, Pharm.D.  Positive wound culture  []  Patient discharged without antimicrobial prescription and treatment is now indicated [x]  Organism is resistant to prescribed ED discharge antimicrobial []  Patient with positive blood cultures  Changes discussed with ED provider: Emilia BeckKaitlyn Szekalski PA-C New antibiotic prescription: Keflex 500 mg TID x 7 days    [facility] Samyrah Bruster 02/06/2014, 11:12 AM

## 2014-02-06 NOTE — Progress Notes (Signed)
ED Antimicrobial Stewardship Positive Culture Follow Up   Susan [name] is an 28 y.o. female who [facility] with a chief complaint of  Chief Complaint  Patient presents with  . Abscess    Recent Results (from the past 720 hour(s))  WOUND CULTURE     Status: None   Collection Time    02/03/14 10:09 AM      Result Value Ref Range Status   Specimen Description WOUND RIGHT ARM   Final   Special Requests Normal   Final   Gram Stain     Final   Value: MODERATE WBC PRESENT, PREDOMINANTLY PMN     NO SQUAMOUS EPITHELIAL CELLS SEEN     MODERATE GRAM NEGATIVE RODS     [facility]   Culture     Final   Value: MODERATE PROTEUS MIRABILIS     [facility] at [facility]   Report Status 02/05/2014 FINAL   Final   Organism ID, Bacteria PROTEUS MIRABILIS   Final    [x]  Treated with clindamycin, organism resistant to prescribed antimicrobial  New antibiotic prescription: Discontinue clindamycin, and begin Keflex PO 500mg  TID x7d  ED Provider: Emilia BeckKaitlyn Szekalski, PA-C  Shelba FlakeNathan E. Achilles Dunkope, PharmD Clinical Pharmacist - Resident Pager: [phone] Pharmacy: [phone] 02/06/2014 10:03 AM

## 2014-02-13 ENCOUNTER — Telehealth (HOSPITAL_BASED_OUTPATIENT_CLINIC_OR_DEPARTMENT_OTHER): Payer: Self-pay

## 2014-02-13 NOTE — Telephone Encounter (Signed)
LVM requesting callback.  Unable to reach x 4 telephone attempts.  [facility]

## 2014-02-15 NOTE — Telephone Encounter (Signed)
Rx for Keflex 500 mg TID x 7 days, prescribed by Emilia BeckKaitlyn Szekalski PA-C, [facility] [phone]) by Jaci Lazieroni Festerman PFM.

## 2014-02-15 NOTE — Telephone Encounter (Signed)
[facility] [address].

## 2014-03-28 ENCOUNTER — Emergency Department (HOSPITAL_COMMUNITY)
Admission: EM | Admit: 2014-03-28 | Discharge: 2014-03-28 | Disposition: A | Discharge status: Home / Self Care | Payer: Medicaid Other | Attending: Emergency Medicine | Admitting: Emergency Medicine

## 2014-03-28 ENCOUNTER — Encounter (HOSPITAL_COMMUNITY): Payer: Self-pay | Admitting: Emergency Medicine

## 2014-03-28 DIAGNOSIS — Z792 Long term (current) use of antibiotics: Secondary | ICD-10-CM | POA: Insufficient documentation

## 2014-03-28 DIAGNOSIS — Z8619 Personal history of other infectious and parasitic diseases: Secondary | ICD-10-CM | POA: Insufficient documentation

## 2014-03-28 DIAGNOSIS — L02412 Cutaneous abscess of left axilla: Secondary | ICD-10-CM

## 2014-03-28 DIAGNOSIS — Z8742 Personal history of other diseases of the female genital tract: Secondary | ICD-10-CM | POA: Insufficient documentation

## 2014-03-28 DIAGNOSIS — Z5189 Encounter for other specified aftercare: Secondary | ICD-10-CM | POA: Insufficient documentation

## 2014-03-28 DIAGNOSIS — Z79899 Other long term (current) drug therapy: Secondary | ICD-10-CM | POA: Insufficient documentation

## 2014-03-28 DIAGNOSIS — IMO0002 Reserved for concepts with insufficient information to code with codable children: Secondary | ICD-10-CM | POA: Insufficient documentation

## 2014-03-28 MED ORDER — HYDROCODONE-ACETAMINOPHEN 5-325 MG PO TABS: 1.0000 | ORAL_TABLET | Freq: Four times a day (QID) | ORAL | Status: DC | PRN

## 2014-03-28 MED ORDER — CHLORHEXIDINE GLUCONATE 2 % EX PADS: 1.0000 [IU] | MEDICATED_PAD | Freq: Two times a day (BID) | CUTANEOUS | Status: AC

## 2014-03-28 MED ORDER — LIDOCAINE-EPINEPHRINE 2 %-1:100000 IJ SOLN
20.0000 mL | Freq: Once | INTRAMUSCULAR | Status: DC
  Filled 2014-03-28: qty 20

## 2014-03-28 NOTE — ED Notes (Signed)
meds at bedside for PA.

## 2014-03-28 NOTE — ED Provider Notes (Signed)
CSN: [id number]     Arrival date & time 03/28/14  0749 History   First MD Initiated Contact with Patient 03/28/14 0751     No chief complaint on file.    (Consider location/radiation/quality/duration/timing/severity/associated sxs/prior Treatment) Patient is a 28 y.o. female presenting with abscess.  Abscess Size:  4X2 cm Abscess quality: fluctuance and painful   Abscess quality: no induration, no redness and no warmth   Red streaking: no   Duration:  3 days Progression:  Worsening Pain details:    Quality:  Sharp   Severity:  No pain Chronicity:  New Context: not diabetes, not immunosuppression and not injected drug use   Relieved by:  None tried Worsened by:  Nothing tried Ineffective treatments:  None tried Associated symptoms: no anorexia, no fever, no headaches, no nausea and no vomiting     Past Medical History  Diagnosis Date  . Chlamydia   . Pelvic inflammatory disease (PID)    Past Surgical History  Procedure Laterality Date  . Induced abortion     Family History  Problem Relation Age of Onset  . Hypertension Mother   . Lupus Sister   . Sickle cell trait Sister   . Sickle cell trait Brother   . Hypertension Maternal Grandmother   . Heart disease Maternal Grandmother   . Lupus Maternal Grandfather   . Diabetes Maternal Aunt   . Hypertension Maternal Aunt    History  Substance Use Topics  . Smoking status: Never Smoker   . Smokeless tobacco: Never Used  . Alcohol Use: No   OB History   Grav Para Term Preterm Abortions TAB SAB Ect Mult Living   5 4 4  0 1 1 0 0 0 4     Review of Systems  Constitutional: Negative for fever and activity change.  HENT: Negative for congestion and facial swelling.   Eyes: Negative for discharge and redness.  Respiratory: Negative for cough and shortness of breath.   Cardiovascular: Negative for chest pain and palpitations.  Gastrointestinal: Negative for nausea, vomiting, abdominal pain, abdominal distention and  anorexia.  Endocrine: Negative for polydipsia and polyuria.  Genitourinary: Negative for dysuria and menstrual problem.  Musculoskeletal: Negative for back pain and joint swelling.  Skin: Negative for color change and wound.  Neurological: Negative for dizziness, light-headedness and headaches.      Allergies  Review of patient's allergies indicates no known allergies.  Home Medications   Prior to Admission medications   Medication Sig Start Date End Date Taking? Authorizing Provider  clindamycin (CLEOCIN) 150 MG capsule Take 2 capsules (300 mg total) by mouth 3 (three) times daily. May dispense as 150mg  capsules 02/03/14   Emilia BeckKaitlyn Szekalski, PA-C  cyclobenzaprine (FLEXERIL) 10 MG tablet Take 1 tablet (10 mg total) by mouth 3 (three) times daily as needed for muscle spasms. 01/21/14   Trixie DredgeEmily West, PA-C  HYDROcodone-acetaminophen (NORCO/VICODIN) 5-325 MG per tablet Take 2 tablets by mouth every 4 (four) hours as needed. 02/03/14   Kaitlyn Szekalski, PA-C  ibuprofen (ADVIL,MOTRIN) 800 MG tablet Take 1 tablet (800 mg total) by mouth every 8 (eight) hours as needed for mild pain or moderate pain. 01/21/14   Trixie DredgeEmily West, PA-C   There were no vitals taken for this visit. Physical Exam  Nursing note and vitals reviewed. Constitutional: She is oriented to person, place, and time. She appears well-developed and well-nourished.  HENT:  Head: Normocephalic and atraumatic.  Eyes: Conjunctivae and EOM are normal. Right eye exhibits no discharge. Left eye  exhibits no discharge.  Cardiovascular: Normal rate and regular rhythm.   Pulmonary/Chest: Effort normal and breath sounds normal. No respiratory distress.  Abdominal: Soft. She exhibits no distension. There is no tenderness. There is no rebound.  Musculoskeletal: Normal range of motion. She exhibits no edema and no tenderness.  Neurological: She is alert and oriented to person, place, and time.  Skin: Skin is warm and dry.  Large area of fluctuance  and ttp under left axilla.     ED Course  INCISION AND DRAINAGE Date/Time: 03/28/2014 8:11 AM Performed by: Marily Memos Authorized by: Raelyn Number Consent: Verbal consent obtained. Risks and benefits: risks, benefits and alternatives were discussed Consent given by: patient Patient understanding: patient states understanding of the procedure being performed Patient consent: the patient's understanding of the procedure matches consent given Required items: required blood products, implants, devices, and special equipment available Patient identity confirmed: verbally with patient and arm band Type: abscess Body area: upper extremity Anesthesia: local infiltration Local anesthetic: lidocaine 2% with epinephrine Anesthetic total: 3 ml Patient sedated: no Scalpel size: 11 Needle gauge: 22 Incision type: elliptical Complexity: complex Drainage: purulent and  bloody Drainage amount: copious Wound treatment: wound left open Packing material: 1/4 in iodoform gauze Patient tolerance: Patient tolerated the procedure well with no immediate complications.   (including critical care time) Labs Review Labs Reviewed - No data to display  Imaging Review No results found.   EKG Interpretation None      MDM   Final diagnoses:  None    28 yo F w/ recurrent abscess under left axilla. No systemic symptoms. No cellulitis. Drained as per above procedure note. Copious amounts of purulent drainage, minimal blood. Multiple loculations. Drained, rinsed, packed. Will give rx for pain meds and chlorhexidine. Will remove packing slowly on her own or follow up here in 2 days for wound check. i gave her the option. Will get pcp to work on recurrent abscess management.     Marily Memos, MD 03/28/14 [phone]

## 2014-03-28 NOTE — ED Notes (Signed)
PT states abscess under arm for few days; denies fever and chills. Hx of these previously.

## 2014-03-28 NOTE — ED Provider Notes (Signed)
I saw and evaluated the patient, reviewed the resident's note and I agree with the findings and plan.   EKG Interpretation None      Pt is a 28 y.o. F with abscess under the left axilla that's been present for several days. No specific symptoms of fever, chills, nausea, vomiting or diarrhea. Patient has no surrounding erythema or warmth but has a large left axillary abscess.  Resident has I&D'ed abscess. I was present for all critical steps of the procedure. Given she has a cellulitis, I do not feel she is to discharged home on antibiotics.  Susan [name] Ward, DO 03/28/14 [phone]

## 2014-03-28 NOTE — ED Notes (Signed)
PA at bedside with US

## 2014-03-28 NOTE — ED Notes (Signed)
PT ambulated with baseline gait; VSS; A&Ox3; no signs of distress; respirations even and unlabored; skin warm and dry; no questions upon discharge.  

## 2014-07-15 ENCOUNTER — Emergency Department (HOSPITAL_COMMUNITY)
Admission: EM | Admit: 2014-07-15 | Discharge: 2014-07-15 | Disposition: A | Discharge status: Home / Self Care | Payer: Medicaid Other | Attending: Emergency Medicine | Admitting: Emergency Medicine

## 2014-07-15 ENCOUNTER — Encounter (HOSPITAL_COMMUNITY): Payer: Self-pay | Admitting: Emergency Medicine

## 2014-07-15 DIAGNOSIS — IMO0002 Reserved for concepts with insufficient information to code with codable children: Secondary | ICD-10-CM | POA: Insufficient documentation

## 2014-07-15 DIAGNOSIS — Z3201 Encounter for pregnancy test, result positive: Secondary | ICD-10-CM | POA: Insufficient documentation

## 2014-07-15 DIAGNOSIS — Z8619 Personal history of other infectious and parasitic diseases: Secondary | ICD-10-CM | POA: Insufficient documentation

## 2014-07-15 DIAGNOSIS — Y92410 Unspecified street and highway as the place of occurrence of the external cause: Secondary | ICD-10-CM | POA: Insufficient documentation

## 2014-07-15 DIAGNOSIS — Z8742 Personal history of other diseases of the female genital tract: Secondary | ICD-10-CM | POA: Insufficient documentation

## 2014-07-15 DIAGNOSIS — Y9389 Activity, other specified: Secondary | ICD-10-CM | POA: Insufficient documentation

## 2014-07-15 DIAGNOSIS — W19XXXA Unspecified fall, initial encounter: Secondary | ICD-10-CM

## 2014-07-15 LAB — URINALYSIS, ROUTINE W REFLEX MICROSCOPIC
Bilirubin Urine: NEGATIVE
Glucose, UA: NEGATIVE mg/dL
Hgb urine dipstick: NEGATIVE
Ketones, ur: NEGATIVE mg/dL
Nitrite: NEGATIVE
PROTEIN: NEGATIVE mg/dL
Specific Gravity, Urine: 1.024 (ref 1.005–1.030)
UROBILINOGEN UA: 1 mg/dL (ref 0.0–1.0)
pH: 5.5 (ref 5.0–8.0)

## 2014-07-15 LAB — URINE MICROSCOPIC-ADD ON

## 2014-07-15 LAB — POC URINE PREG, ED: PREG TEST UR: POSITIVE — AB

## 2014-07-15 MED ORDER — ACETAMINOPHEN 325 MG PO TABS: 650.0000 mg | ORAL_TABLET | Freq: Four times a day (QID) | ORAL | Status: DC | PRN

## 2014-07-15 NOTE — ED Provider Notes (Signed)
CSN: [id number]     Arrival date & time 07/15/14  1858 History   This chart was scribed for Harle Battiest, NP Hurman Horn, MD by Evon Slack, ED Scribe. This patient was seen in room TR07C/TR07C and the patient's care was started at 9:07 PM.      Chief Complaint  Patient presents with  . Fall  . Possible Pregnancy   The history is provided by the patient. No language interpreter was used.   HPI Comments: Susan [name] is a 28 y.o. female who presents to the Emergency Department complaining of fall onset today. 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 She states she has associated intermittent throbbing back pain. She states that her pain is 7/10. She states that sitting makes her pain worse. She states that she thinks she may be pregnant and was concerned. She denies vaginal bleeding or bowel/ bladder incontinence.   Past Medical History  Diagnosis Date  . Chlamydia   . Pelvic inflammatory disease (PID)    Past Surgical History  Procedure Laterality Date  . Induced abortion     Family History  Problem Relation Age of Onset  . Hypertension Mother   . Lupus Sister   . Sickle cell trait Sister   . Sickle cell trait Brother   . Hypertension Maternal Grandmother   . Heart disease Maternal Grandmother   . Lupus Maternal Grandfather   . Diabetes Maternal Aunt   . Hypertension Maternal Aunt    History  Substance Use Topics  . Smoking status: Never Smoker   . Smokeless tobacco: Never Used  . Alcohol Use: No   OB History   Grav Para Term Preterm Abortions TAB SAB Ect Mult Living   0 1 1 0 0 0 4     Review of Systems  Genitourinary: Negative for vaginal bleeding.  Musculoskeletal: Positive for back pain. Negative for gait problem.    Allergies  Review of patient's allergies indicates no known allergies.  Home Medications   Prior to Admission medications   Medication Sig Start Date End Date Taking? Authorizing Provider   HYDROcodone-acetaminophen (NORCO/VICODIN) 5-325 MG per tablet Take 1-2 tablets by mouth every 6 (six) hours as needed for moderate pain. 03/28/14   Marily Memos, MD   Triage Vitals: BP 145/78  Pulse 94  Temp(Src) 98.6 F (37 C) (Oral)  Resp 16  Ht  (1.626 m)  Wt 200 lb (90.719 kg)  BMI 34.31 kg/m2  SpO2 100%  LMP 06/11/2014  Physical Exam  Nursing note and vitals reviewed. Constitutional: She is oriented to person, place, and time. She appears well-developed and well-nourished. No distress.  HENT:  Head: Normocephalic and atraumatic.  Eyes: Conjunctivae are normal.  Cardiovascular: Normal rate.   Pulmonary/Chest: Effort normal. No respiratory distress.  Musculoskeletal: Normal range of motion.  No cervical or thoracic, lumbar tenderness sacrum tenderness, no gait problems  Neurological: She is alert and oriented to person, place, and time.  5/5 strength with plantar or dorsi flexion, 5/5 strength on SLR bilaterally   Skin: Skin is warm and dry.  no ecchymosis  Psychiatric: She has a normal mood and affect. Her behavior is normal.    ED Course  Procedures (including critical care time) DIAGNOSTIC STUDIES: Oxygen Saturation is 100% on RA, normal by my interpretation.    COORDINATION OF CARE: 9:14 PM-Discussed treatment plan which includes Tylenol with pt at bedside and pt agreed to plan.  Labs Review Labs Reviewed  URINALYSIS, ROUTINE W REFLEX MICROSCOPIC - Abnormal; Notable for the following:    APPearance CLOUDY (*)    Leukocytes, UA SMALL (*)    All other components within normal limits  URINE MICROSCOPIC-ADD ON - Abnormal; Notable for the following:    Squamous Epithelial / LPF MANY (*)    Bacteria, UA MANY (*)    All other components within normal limits  POC URINE PREG, ED - Abnormal; Notable for the following:    Preg Test, Ur POSITIVE (*)    All other components within normal limits    Imaging Review No results found.   EKG Interpretation None       MDM   Final diagnoses:  Fall, initial encounter  Positive pregnancy test   28 yo female fell from standing when slipped on bus steps.  Pt reports soreness when sitting but no objective exam findings.  She is able to ambulate.  No imaging due to incidental positive urine pregnancy test.  Pt made aware she reports her lmp was 06/12/2014 and lasted 5 days.  Pt denies any vaginal bleeding or abd cramping since the fall.  She appears well and is safe to be discharged.  Discharge instructions include symptomatic mgmt with tylenol and instructions to avoid all NSAIDs while pregnant, referral to establish prenatal care and for follow-up of injury.  Pt verbalizes understanding and in agreement with plan.  Return precautions provided.      I personally performed the services described in this documentation, which was scribed in my presence. The recorded information has been reviewed and is accurate.  Filed Vitals:   07/15/14 1914 07/15/14 2158  BP: 145/78 120/78  Pulse: 94 68  Temp: 98.6 F (37 C) 97.9 F (36.6 C)  TempSrc: Oral   Resp: 16 16  Height:  (1.626 m)   Weight: 200 lb (90.719 kg)   SpO2: 100% 98%   Meds given in ED:  Medications - No data to display  Discharge Medication List as of 07/15/2014  9:29 PM       Harle Battiest, NP 07/20/14 1204

## 2014-07-15 NOTE — Discharge Instructions (Signed)
Please read and follow all the directions provided.  Call the referral in the morning to establish care regarding this positive pregnancy test.  If you have any new or worsening symptoms please fell free to come back.    WHEN SHOULD I SEEK IMMEDIATE MEDICAL CARE?  You fall on your abdomen or experience any high-force accident or injury.  You have been assaulted (domestic or otherwise).  You have been in a car accident.  You develop vaginal bleeding.  You develop fluid leaking from the vagina.  You develop uterine contractions (pelvic cramping, pain, or significant low back pain).  You become weak or faint, or have uncontrolled vomiting after trauma.  You had a serious burn. This includes burns to the face, neck, hands, or genitals, or burns greater than the size of your palm anywhere else.  You develop neck stiffness or pain after a fall or from other trauma.  You develop a headache or vision problems after a fall or from other trauma.  You do not feel the baby moving or the baby is not moving as much as before a fall or other trauma.

## 2014-07-15 NOTE — ED Notes (Signed)
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 Pt also thinks she might be pregnant, she took a home pregnancy test which read positive. Pt denies any vaginal bleeding.

## 2014-07-24 NOTE — ED Provider Notes (Signed)
Medical screening examination/treatment/procedure(s) were performed by non-physician practitioner and as supervising physician I was immediately available for consultation/collaboration.   EKG Interpretation None       Hurman HornJohn M Koltan Portocarrero, MD 07/24/14 2043

## 2014-08-19 ENCOUNTER — Encounter (HOSPITAL_COMMUNITY): Payer: Self-pay | Admitting: Emergency Medicine

## 2014-10-18 NOTE — L&D Delivery Note (Signed)
Delivery Note At 10:32 PM a viable female was delivered via Vaginal, Spontaneous Delivery (Presentation: Left Occiput Anterior).  APGAR: 8, 9; weight  .   Placenta status: Intact, Spontaneous.  Cord: 3 vessels with the following complications: None.  Cord pH: n/a  Anesthesia: Epidural  Episiotomy: None Lacerations: None Suture Repair: n/a Est. Blood Loss (mL): 127  Mom to postpartum.  Baby to Couplet care / Skin to Skin.  Susan [name] 02/27/2015, 11:32 PM

## 2014-11-22 LAB — OB RESULTS CONSOLE GC/CHLAMYDIA
CHLAMYDIA, DNA PROBE: NEGATIVE
GC PROBE AMP, GENITAL: NEGATIVE

## 2014-11-22 LAB — OB RESULTS CONSOLE RPR: RPR: NONREACTIVE

## 2014-11-22 LAB — OB RESULTS CONSOLE HEPATITIS B SURFACE ANTIGEN: Hepatitis B Surface Ag: NEGATIVE

## 2014-11-22 LAB — OB RESULTS CONSOLE HIV ANTIBODY (ROUTINE TESTING): HIV: NONREACTIVE

## 2014-11-22 LAB — OB RESULTS CONSOLE RUBELLA ANTIBODY, IGM: RUBELLA: IMMUNE

## 2014-11-27 ENCOUNTER — Other Ambulatory Visit: Payer: Self-pay | Admitting: Obstetrics & Gynecology

## 2014-11-27 DIAGNOSIS — R599 Enlarged lymph nodes, unspecified: Secondary | ICD-10-CM

## 2014-12-03 ENCOUNTER — Other Ambulatory Visit: Payer: Medicaid Other

## 2014-12-19 ENCOUNTER — Encounter (HOSPITAL_COMMUNITY): Payer: Self-pay | Admitting: *Deleted

## 2014-12-19 ENCOUNTER — Inpatient Hospital Stay (HOSPITAL_COMMUNITY)
Admission: AD | Admit: 2014-12-19 | Discharge: 2014-12-19 | Disposition: A | Discharge status: Home / Self Care | Payer: PRIVATE HEALTH INSURANCE | Source: Ambulatory Visit | Attending: Obstetrics | Admitting: Obstetrics

## 2014-12-19 DIAGNOSIS — R197 Diarrhea, unspecified: Secondary | ICD-10-CM | POA: Diagnosis not present

## 2014-12-19 DIAGNOSIS — O219 Vomiting of pregnancy, unspecified: Secondary | ICD-10-CM

## 2014-12-19 DIAGNOSIS — Z3A26 26 weeks gestation of pregnancy: Secondary | ICD-10-CM | POA: Diagnosis not present

## 2014-12-19 DIAGNOSIS — O368120 Decreased fetal movements, second trimester, not applicable or unspecified: Secondary | ICD-10-CM | POA: Insufficient documentation

## 2014-12-19 DIAGNOSIS — O212 Late vomiting of pregnancy: Secondary | ICD-10-CM | POA: Insufficient documentation

## 2014-12-19 LAB — URINALYSIS, ROUTINE W REFLEX MICROSCOPIC
BILIRUBIN URINE: NEGATIVE
Glucose, UA: NEGATIVE mg/dL
HGB URINE DIPSTICK: NEGATIVE
KETONES UR: NEGATIVE mg/dL
Leukocytes, UA: NEGATIVE
NITRITE: NEGATIVE
Protein, ur: NEGATIVE mg/dL
UROBILINOGEN UA: 0.2 mg/dL (ref 0.0–1.0)
pH: 6 (ref 5.0–8.0)

## 2014-12-19 LAB — COMPREHENSIVE METABOLIC PANEL
ALT: 13 U/L (ref 0–35)
AST: 16 U/L (ref 0–37)
Albumin: 3.4 g/dL — ABNORMAL LOW (ref 3.5–5.2)
Alkaline Phosphatase: 80 U/L (ref 39–117)
Anion gap: 7 (ref 5–15)
BUN: 7 mg/dL (ref 6–23)
CO2: 25 mmol/L (ref 19–32)
Calcium: 9 mg/dL (ref 8.4–10.5)
Chloride: 105 mmol/L (ref 96–112)
Creatinine, Ser: 0.47 mg/dL — ABNORMAL LOW (ref 0.50–1.10)
GFR calc Af Amer: >90 mL/min (ref >90)
GFR calc non Af Amer: >90 mL/min (ref >90)
Glucose, Bld: 85 mg/dL (ref 70–99)
Potassium: 4.1 mmol/L (ref 3.5–5.1)
Sodium: 137 mmol/L (ref 135–145)
Total Bilirubin: 0.3 mg/dL (ref 0.3–1.2)
Total Protein: 6.9 g/dL (ref 6.0–8.3)

## 2014-12-19 LAB — CBC
HCT: 36 % (ref 36.0–46.0)
Hemoglobin: 12.3 g/dL (ref 12.0–15.0)
MCH: 30.1 pg (ref 26.0–34.0)
MCHC: 34.2 g/dL (ref 30.0–36.0)
MCV: 88 fL (ref 78.0–100.0)
Platelets: 214 10*3/uL (ref 150–400)
RBC: 4.09 MIL/uL (ref 3.87–5.11)
RDW: 12.8 % (ref 11.5–15.5)
WBC: 7.5 10*3/uL (ref 4.0–10.5)

## 2014-12-19 MED ORDER — ONDANSETRON HCL 4 MG PO TABS
8.0000 mg | ORAL_TABLET | Freq: Once | ORAL | Status: AC
  Administered 2014-12-19: 8 mg via ORAL
  Filled 2014-12-19: qty 2

## 2014-12-19 MED ORDER — ONDANSETRON HCL 4 MG PO TABS: 4.0000 mg | ORAL_TABLET | Freq: Three times a day (TID) | ORAL | Status: DC | PRN

## 2014-12-19 NOTE — MAU Note (Signed)
Pt reports she started having vomiting at work with diarrhea. . C/o Abd cramping and back ache and headache.

## 2014-12-19 NOTE — MAU Provider Note (Signed)
History     CSN: [id number]  Arrival date and time: 12/19/14 1646   None     Chief Complaint  Patient presents with  . Emesis   HPI  29 y.o. [id number] @[redacted]w[redacted]d  states she had one episode of vomiting around 11 this morning and 2 episodes of diarrhea. Pt states she has been nauseous for a couple of days and her 231 yr old daughter has had vomiting and diarrhea as well. Pt states she has been able to drink this afternoon and last ate this morning prior to vomiting. Pt denies any bleeding, LOF, or contractions. Pt states she was not feeling FM earlier today but has since started feeling baby move.          Past Medical History  Diagnosis Date  . Chlamydia   . Pelvic inflammatory disease (PID)     Past Surgical History  Procedure Laterality Date  . Induced abortion      Family History  Problem Relation Age of Onset  . Hypertension Mother   . Lupus Sister   . Sickle cell trait Sister   . Sickle cell trait Brother   . Hypertension Maternal Grandmother   . Heart disease Maternal Grandmother   . Lupus Maternal Grandfather   . Diabetes Maternal Aunt   . Hypertension Maternal Aunt     History  Substance Use Topics  . Smoking status: Never Smoker   . Smokeless tobacco: Never Used  . Alcohol Use: No    Allergies: No Known Allergies  Prescriptions prior to admission  Medication Sig Dispense Refill Last Dose  . HYDROcodone-acetaminophen (NORCO/VICODIN) 5-325 MG per tablet Take 1-2 tablets by mouth every 6 (six) hours as needed for moderate pain. 12 tablet 0     Review of Systems  Constitutional: Negative.   HENT: Negative.   Eyes: Negative.   Respiratory: Negative.   Cardiovascular: Negative.   Gastrointestinal: Positive for nausea, vomiting and diarrhea.  Genitourinary: Negative.   Musculoskeletal: Negative.   Skin: Negative.   Neurological: Negative.   Endo/Heme/Allergies: Negative.   Psychiatric/Behavioral: Negative.    Physical Exam   Blood pressure  126/74, pulse 77, temperature 98.6 F (37 C), temperature source Oral, resp. rate 18, height 5' 5.5" (1.664 m), weight 105.235 kg (232 lb), last menstrual period 06/11/2014, not currently breastfeeding.  Physical Exam  Nursing note and vitals reviewed. Constitutional: She is oriented to person, place, and time. She appears well-developed and well-nourished.  HENT:  Head: Normocephalic and atraumatic.  Neck: Normal range of motion.  Cardiovascular: Normal rate.   Respiratory: Effort normal. No respiratory distress.  GI: Soft. She exhibits no distension and no mass. There is no tenderness. There is no rebound and no guarding.  Musculoskeletal: Normal range of motion.  Neurological: She is alert and oriented to person, place, and time.  Skin: Skin is warm and dry.  Psychiatric: She has a normal mood and affect. Her behavior is normal. Judgment and thought content normal.    MAU Course  Procedures  Zofran 8mg  PO PO Fluids- Declines IVF  Results for orders placed or performed during the hospital encounter of 12/19/14 (from the past 24 hour(s))  Urinalysis, Routine w reflex microscopic     Status: Abnormal   Collection Time: 12/19/14  5:09 PM  Result Value Ref Range   Color, Urine AMBER (A) YELLOW   APPearance CLEAR CLEAR   Specific Gravity, Urine >1.030 (H) 1.005 - 1.030   pH 6.0 5.0 - 8.0   Glucose, UA NEGATIVE  NEGATIVE mg/dL   Hgb urine dipstick NEGATIVE NEGATIVE   Bilirubin Urine NEGATIVE NEGATIVE   Ketones, ur NEGATIVE NEGATIVE mg/dL   Protein, ur NEGATIVE NEGATIVE mg/dL   Urobilinogen, UA 0.2 0.0 - 1.0 mg/dL   Nitrite NEGATIVE NEGATIVE   Leukocytes, UA NEGATIVE NEGATIVE  CBC     Status: None   Collection Time: 12/19/14  8:10 PM  Result Value Ref Range   WBC 7.5 4.0 - 10.5 K/uL   RBC 4.09 3.87 - 5.11 MIL/uL   Hemoglobin 12.3 12.0 - 15.0 g/dL   HCT 60.4 54.0 - 98.1 %   MCV 88.0 78.0 - 100.0 fL   MCH 30.1 26.0 - 34.0 pg   MCHC 34.2 30.0 - 36.0 g/dL   RDW 19.1 47.8 -  29.5 %   Platelets 214 150 - 400 K/uL    Assessment and Plan  Nausea and vomiting Diarrhea Decreased fetal Movement FHT's Cat1 No Uc's  P: Discharge to home per Dr. Mendel Corning as regularly scheduled       Clemmons,Lori Grissett 12/19/2014, 7:51 PM

## 2014-12-19 NOTE — Discharge Instructions (Signed)
Diarrhea Diarrhea is frequent loose and watery bowel movements. It can cause you to feel weak and dehydrated. Dehydration can cause you to become tired and thirsty, have a dry mouth, and have decreased urination that often is dark yellow. Diarrhea is a sign of another problem, most often an infection that will not last long. In most cases, diarrhea typically lasts 2-3 days. However, it can last longer if it is a sign of something more serious. It is important to treat your diarrhea as directed by your caregiver to lessen or prevent future episodes of diarrhea. CAUSES  Some common causes include:  Gastrointestinal infections caused by viruses, bacteria, or parasites.  Food poisoning or food allergies.  Certain medicines, such as antibiotics, chemotherapy, and laxatives.  Artificial sweeteners and fructose.  Digestive disorders. HOME CARE INSTRUCTIONS  Ensure adequate fluid intake (hydration): Have 1 cup (8 oz) of fluid for each diarrhea episode. Avoid fluids that contain simple sugars or sports drinks, fruit juices, whole milk products, and sodas. Your urine should be clear or pale yellow if you are drinking enough fluids. Hydrate with an oral rehydration solution that you can purchase at pharmacies, retail stores, and online. You can prepare an oral rehydration solution at home by mixing the following ingredients together:   - tsp table salt.   tsp baking soda.   tsp salt substitute containing potassium chloride.  1  tablespoons sugar.  1 L (34 oz) of water.  Certain foods and beverages may increase the speed at which food moves through the gastrointestinal (GI) tract. These foods and beverages should be avoided and include:  Caffeinated and alcoholic beverages.  High-fiber foods, such as raw fruits and vegetables, nuts, seeds, and whole grain breads and cereals.  Foods and beverages sweetened with sugar alcohols, such as xylitol, sorbitol, and mannitol.  Some foods may be well  tolerated and may help thicken stool including:  Starchy foods, such as rice, toast, pasta, low-sugar cereal, oatmeal, grits, baked potatoes, crackers, and bagels.  Bananas.  Applesauce.  Add probiotic-rich foods to help increase healthy bacteria in the GI tract, such as yogurt and fermented milk products.  Wash your hands well after each diarrhea episode.  Only take over-the-counter or prescription medicines as directed by your caregiver.  Take a warm bath to relieve any burning or pain from frequent diarrhea episodes. SEEK IMMEDIATE MEDICAL CARE IF:   You are unable to keep fluids down.  You have persistent vomiting.  You have blood in your stool, or your stools are black and tarry.  You do not urinate in 6-8 hours, or there is only a small amount of very dark urine.  You have abdominal pain that increases or localizes.  You have weakness, dizziness, confusion, or light-headedness.  You have a severe headache.  Your diarrhea gets worse or does not get better.  You have a fever or persistent symptoms for more than 2-3 days.  You have a fever and your symptoms suddenly get worse. MAKE SURE YOU:   Understand these instructions.  Will watch your condition.  Will get help right away if you are not doing well or get worse. Document Released: 09/24/2002 Document Revised: 02/18/2014 Document Reviewed: 06/11/2012 [facility] Patient Information 2015 [place], [place]. This information is not intended to replace advice given to you by your health care provider. Make sure you discuss any questions you have with your health care provider. Fetal Movement Counts Patient Name: __________________________________________________ Patient Due Date: ____________________ Performing a fetal movement count is highly  recommended in high-risk pregnancies, but it is good for every pregnant woman to do. Your health care provider may ask you to start counting fetal movements at 28 weeks of the  pregnancy. Fetal movements often increase:  After eating a full meal.  After physical activity.  After eating or drinking something sweet or cold.  At rest. Pay attention to when you feel the baby is most active. This will help you notice a pattern of your baby's sleep and wake cycles and what factors contribute to an increase in fetal movement. It is important to perform a fetal movement count at the same time each day when your baby is normally most active.  HOW TO COUNT FETAL MOVEMENTS  Find a quiet and comfortable area to sit or lie down on your left side. Lying on your left side provides the best blood and oxygen circulation to your baby.  Write down the day and time on a sheet of paper or in a journal.  Start counting kicks, flutters, swishes, rolls, or jabs in a 2-hour period. You should feel at least 10 movements within 2 hours.  If you do not feel 10 movements in 2 hours, wait 2-3 hours and count again. Look for a change in the pattern or not enough counts in 2 hours. SEEK MEDICAL CARE IF:  You feel less than 10 counts in 2 hours, tried twice.  There is no movement in over an hour.  The pattern is changing or taking longer each day to reach 10 counts in 2 hours.  You feel the baby is not moving as he or she usually does. Date: ____________ Movements: ____________ Start time: ____________ Doreatha MartinFinish time: ____________  Date: ____________ Movements: ____________ Start time: ____________ Doreatha MartinFinish time: ____________ Date: ____________ Movements: ____________ Start time: ____________ Doreatha MartinFinish time: ____________ Date: ____________ Movements: ____________ Start time: ____________ Doreatha MartinFinish time: ____________ Date: ____________ Movements: ____________ Start time: ____________ Doreatha MartinFinish time: ____________ Date: ____________ Movements: ____________ Start time: ____________ Doreatha MartinFinish time: ____________ Date: ____________ Movements: ____________ Start time: ____________ Doreatha MartinFinish time: ____________ Date:  ____________ Movements: ____________ Start time: ____________ Doreatha MartinFinish time: ____________  Date: ____________ Movements: ____________ Start time: ____________ Doreatha MartinFinish time: ____________ Date: ____________ Movements: ____________ Start time: ____________ Doreatha MartinFinish time: ____________ Date: ____________ Movements: ____________ Start time: ____________ Doreatha MartinFinish time: ____________ Date: ____________ Movements: ____________ Start time: ____________ Doreatha MartinFinish time: ____________ Date: ____________ Movements: ____________ Start time: ____________ Doreatha MartinFinish time: ____________ Date: ____________ Movements: ____________ Start time: ____________ Doreatha MartinFinish time: ____________ Date: ____________ Movements: ____________ Start time: ____________ Doreatha MartinFinish time: ____________  Date: ____________ Movements: ____________ Start time: ____________ Doreatha MartinFinish time: ____________ Date: ____________ Movements: ____________ Start time: ____________ Doreatha MartinFinish time: ____________ Date: ____________ Movements: ____________ Start time: ____________ Doreatha MartinFinish time: ____________ Date: ____________ Movements: ____________ Start time: ____________ Doreatha MartinFinish time: ____________ Date: ____________ Movements: ____________ Start time: ____________ Doreatha MartinFinish time: ____________ Date: ____________ Movements: ____________ Start time: ____________ Doreatha MartinFinish time: ____________ Date: ____________ Movements: ____________ Start time: ____________ Doreatha MartinFinish time: ____________  Date: ____________ Movements: ____________ Start time: ____________ Doreatha MartinFinish time: ____________ Date: ____________ Movements: ____________ Start time: ____________ Doreatha MartinFinish time: ____________ Date: ____________ Movements: ____________ Start time: ____________ Doreatha MartinFinish time: ____________ Date: ____________ Movements: ____________ Start time: ____________ Doreatha MartinFinish time: ____________ Date: ____________ Movements: ____________ Start time: ____________ Doreatha MartinFinish time: ____________ Date: ____________ Movements: ____________ Start  time: ____________ Doreatha MartinFinish time: ____________ Date: ____________ Movements: ____________ Start time: ____________ Doreatha MartinFinish time: ____________  Date: ____________ Movements: ____________ Start time: ____________ Doreatha MartinFinish time: ____________ Date: ____________ Movements: ____________ Start time: ____________ Doreatha MartinFinish time: ____________ Date: ____________ Movements: ____________  Start time: ____________ Doreatha Martin time: ____________ Date: ____________ Movements: ____________ Start time: ____________ Doreatha Martin time: ____________ Date: ____________ Movements: ____________ Start time: ____________ Doreatha Martin time: ____________ Date: ____________ Movements: ____________ Start time: ____________ Doreatha Martin time: ____________ Date: ____________ Movements: ____________ Start time: ____________ Doreatha Martin time: ____________  Date: ____________ Movements: ____________ Start time: ____________ Doreatha Martin time: ____________ Date: ____________ Movements: ____________ Start time: ____________ Doreatha Martin time: ____________ Date: ____________ Movements: ____________ Start time: ____________ Doreatha Martin time: ____________ Date: ____________ Movements: ____________ Start time: ____________ Doreatha Martin time: ____________ Date: ____________ Movements: ____________ Start time: ____________ Doreatha Martin time: ____________ Date: ____________ Movements: ____________ Start time: ____________ Doreatha Martin time: ____________ Date: ____________ Movements: ____________ Start time: ____________ Doreatha Martin time: ____________  Date: ____________ Movements: ____________ Start time: ____________ Doreatha Martin time: ____________ Date: ____________ Movements: ____________ Start time: ____________ Doreatha Martin time: ____________ Date: ____________ Movements: ____________ Start time: ____________ Doreatha Martin time: ____________ Date: ____________ Movements: ____________ Start time: ____________ Doreatha Martin time: ____________ Date: ____________ Movements: ____________ Start time: ____________ Doreatha Martin time:  ____________ Date: ____________ Movements: ____________ Start time: ____________ Doreatha Martin time: ____________ Date: ____________ Movements: ____________ Start time: ____________ Doreatha Martin time: ____________  Date: ____________ Movements: ____________ Start time: ____________ Doreatha Martin time: ____________ Date: ____________ Movements: ____________ Start time: ____________ Doreatha Martin time: ____________ Date: ____________ Movements: ____________ Start time: ____________ Doreatha Martin time: ____________ Date: ____________ Movements: ____________ Start time: ____________ Doreatha Martin time: ____________ Date: ____________ Movements: ____________ Start time: ____________ Doreatha Martin time: ____________ Date: ____________ Movements: ____________ Start time: ____________ Doreatha Martin time: ____________ Document Released: 11/03/2006 Document Revised: 02/18/2014 Document Reviewed: 07/31/2012 [employer] Patient Information 2015 [employer]. This information is not intended to replace advice given to you by your health care provider. Make sure you discuss any questions you have with your health care provider.

## 2014-12-19 NOTE — MAU Note (Signed)
Pt states she had one episode of vomiting around 11 this morning and 2 episodes of diarrhea.  Pt states she has been nauseous for a couple of days and her 931 yr old daughter has had vomiting and diarrhea as well.   Pt states she has been able to drink this afternoon and last ate this morning prior to vomiting.   Pt denies any bleeding, LOF, or contractions.  Pt states she was not feeling FM earlier today but has since started feeling baby move.

## 2015-01-21 ENCOUNTER — Ambulatory Visit
Admission: RE | Admit: 2015-01-21 | Discharge: 2015-01-21 | Disposition: A | Discharge status: Home / Self Care | Payer: PRIVATE HEALTH INSURANCE | Source: Ambulatory Visit | Attending: Obstetrics & Gynecology | Admitting: Obstetrics & Gynecology

## 2015-01-21 DIAGNOSIS — R599 Enlarged lymph nodes, unspecified: Secondary | ICD-10-CM

## 2015-01-24 ENCOUNTER — Other Ambulatory Visit: Payer: Self-pay | Admitting: *Deleted

## 2015-01-24 ENCOUNTER — Other Ambulatory Visit: Payer: Self-pay

## 2015-01-24 ENCOUNTER — Other Ambulatory Visit: Payer: Self-pay | Admitting: Emergency Medicine

## 2015-01-24 ENCOUNTER — Other Ambulatory Visit: Payer: Self-pay | Admitting: Obstetrics & Gynecology

## 2015-01-24 DIAGNOSIS — R590 Localized enlarged lymph nodes: Secondary | ICD-10-CM

## 2015-02-04 ENCOUNTER — Other Ambulatory Visit: Payer: Self-pay | Admitting: Obstetrics & Gynecology

## 2015-02-04 ENCOUNTER — Ambulatory Visit
Admission: RE | Admit: 2015-02-04 | Discharge: 2015-02-04 | Disposition: A | Discharge status: Home / Self Care | Payer: PRIVATE HEALTH INSURANCE | Source: Ambulatory Visit | Attending: Obstetrics & Gynecology | Admitting: Obstetrics & Gynecology

## 2015-02-04 DIAGNOSIS — R590 Localized enlarged lymph nodes: Secondary | ICD-10-CM

## 2015-02-08 LAB — CULTURE, ROUTINE-ABSCESS

## 2015-02-10 ENCOUNTER — Encounter (HOSPITAL_COMMUNITY): Payer: Self-pay | Admitting: *Deleted

## 2015-02-10 ENCOUNTER — Inpatient Hospital Stay (HOSPITAL_COMMUNITY)
Admission: AD | Admit: 2015-02-10 | Discharge: 2015-02-10 | Disposition: A | Discharge status: Home / Self Care | Payer: PRIVATE HEALTH INSURANCE | Source: Ambulatory Visit | Attending: Obstetrics | Admitting: Obstetrics

## 2015-02-10 DIAGNOSIS — O368131 Decreased fetal movements, third trimester, fetus 1: Secondary | ICD-10-CM

## 2015-02-10 DIAGNOSIS — O26893 Other specified pregnancy related conditions, third trimester: Secondary | ICD-10-CM

## 2015-02-10 DIAGNOSIS — Z3A33 33 weeks gestation of pregnancy: Secondary | ICD-10-CM | POA: Diagnosis not present

## 2015-02-10 DIAGNOSIS — O368130 Decreased fetal movements, third trimester, not applicable or unspecified: Secondary | ICD-10-CM | POA: Diagnosis not present

## 2015-02-10 DIAGNOSIS — R102 Pelvic and perineal pain: Secondary | ICD-10-CM | POA: Diagnosis not present

## 2015-02-10 DIAGNOSIS — Z3689 Encounter for other specified antenatal screening: Secondary | ICD-10-CM

## 2015-02-10 LAB — URINALYSIS, ROUTINE W REFLEX MICROSCOPIC
Bilirubin Urine: NEGATIVE
Glucose, UA: NEGATIVE mg/dL
HGB URINE DIPSTICK: NEGATIVE
Ketones, ur: NEGATIVE mg/dL
LEUKOCYTES UA: NEGATIVE
NITRITE: NEGATIVE
PH: 6 (ref 5.0–8.0)
Protein, ur: NEGATIVE mg/dL
SPECIFIC GRAVITY, URINE: 1.02 (ref 1.005–1.030)
Urobilinogen, UA: 0.2 mg/dL (ref 0.0–1.0)

## 2015-02-10 NOTE — MAU Provider Note (Signed)
History     CSN: [id number]  Arrival date and time: 02/10/15 1020   First Provider Initiated Contact with Patient 02/10/15 1118      Chief Complaint  Patient presents with  . Pain  . Decreased Fetal Movement   HPI   Ms.Susan [name] is a 29 y.o. female [phone] [id number] at [id number] who presents with multiple pregnancy related complaints. She just hurts all over her body; she feels worn down. She is most concerned about the pelvic pain which started a few weeks ago. The pelvic pain is now more consistent; she is feeling it every day. She currently is not having any pelvic pain, but when she does have it is a 10/10. She is not wearing a pregnancy support belt and has not tried anything over the counter for the pain. She feels the pain worsens when she is walking and when she has to step up to get into her truck at work.   Since she has been to MAU she attests to good fetal movement. She denies leaking of fluid, and denies vaginal bleeding.     OB History    Gravida Para Term Preterm AB TAB SAB Ectopic Multiple Living   6 4 4  0 1 1 0 0 0 4      Past Medical History  Diagnosis Date  . Chlamydia   . Pelvic inflammatory disease (PID)     Past Surgical History  Procedure Laterality Date  . Induced abortion      Family History  Problem Relation Age of Onset  . Hypertension Mother   . Lupus Sister   . Sickle cell trait Sister   . Sickle cell trait Brother   . Hypertension Maternal Grandmother   . Heart disease Maternal Grandmother   . Lupus Maternal Grandfather   . Diabetes Maternal Aunt   . Hypertension Maternal Aunt     History  Substance Use Topics  . Smoking status: Never Smoker   . Smokeless tobacco: Never Used  . Alcohol Use: No    Allergies: No Known Allergies  Prescriptions prior to admission  Medication Sig Dispense Refill Last Dose  . Prenatal Vit-Fe Fumarate-FA (PRENATAL MULTIVITAMIN) TABS tablet Take 1 tablet by mouth daily at 12 noon.   Past Week at Unknown  time  . ondansetron (ZOFRAN) 4 MG tablet Take 1 tablet (4 mg total) by mouth every 8 (eight) hours as needed for nausea or vomiting. (Patient not taking: Reported on 02/10/2015) 30 tablet 0    Results for orders placed or performed during the hospital encounter of 02/10/15 (from the past 48 hour(s))  Urinalysis, Routine w reflex microscopic     Status: None   Collection Time: 02/10/15 10:21 AM  Result Value Ref Range   Color, Urine YELLOW YELLOW   APPearance CLEAR CLEAR   Specific Gravity, Urine 1.020 1.005 - 1.030   pH 6.0 5.0 - 8.0   Glucose, UA NEGATIVE NEGATIVE mg/dL   Hgb urine dipstick NEGATIVE NEGATIVE   Bilirubin Urine NEGATIVE NEGATIVE   Ketones, ur NEGATIVE NEGATIVE mg/dL   Protein, ur NEGATIVE NEGATIVE mg/dL   Urobilinogen, UA 0.2 0.0 - 1.0 mg/dL   Nitrite NEGATIVE NEGATIVE   Leukocytes, UA NEGATIVE NEGATIVE    Comment: MICROSCOPIC NOT DONE ON URINES WITH NEGATIVE PROTEIN, BLOOD, LEUKOCYTES, NITRITE, OR GLUCOSE <1000 mg/dL.    Review of Systems  Eyes: Negative for blurred vision.  Gastrointestinal: Positive for nausea. Abdominal pain: Pelvic pain   Genitourinary: Negative for dysuria and urgency.  Musculoskeletal: Positive for back pain (Lower back pain ).   Physical Exam   Blood pressure 125/71, pulse 91, temperature 98.1 F (36.7 C), temperature source Oral, resp. rate 18, height  (1.626 m), weight 107.049 kg (236 lb), last menstrual period 06/11/2014, not currently breastfeeding.  Physical Exam  Constitutional: She is oriented to person, place, and time. She appears well-developed and well-nourished. No distress.  HENT:  Head: Normocephalic.  Eyes: Pupils are equal, round, and reactive to light.  Respiratory: Effort normal.  GI: Soft. She exhibits no distension. There is no tenderness.  Genitourinary:  Dilation: Closed (outer os open slightly) Exam by:: Shela Commons Rasch, NP  Musculoskeletal: Normal range of motion.  Neurological: She is alert and oriented to  person, place, and time.  Skin: Skin is warm. She is not diaphoretic.  Psychiatric: Her behavior is normal.   Fetal Tracing: Baseline: 135 bpm Variability: Moderate  Accelerations: 15x15 Decelerations: none Toco: quiet    MAU Course  Procedures  None  MDM Patient declines tylenol.   NST Discussed patient with Dr. Chestine Spore.   Assessment and Plan   A:  1. Decreased fetal movement, third trimester, fetus 1   2. NST (non-stress test) reactive   3. Pelvic pain affecting pregnancy in third trimester, antepartum   4.      Round ligament pain   P;  Discharge home in stable condition Keep appointment with OB Return to MAU if symptoms worsen  Pregnancy support belt recommended Preterm labor precautions discussed   Duane Lope, NP 02/10/2015 11:43 AM

## 2015-02-10 NOTE — MAU Note (Signed)
Hurts all over, head- body. Just in pain, believes she is overworking herself; started on Sat. Baby hasn't been moving as much, noted since Saturday.

## 2015-02-25 ENCOUNTER — Other Ambulatory Visit: Payer: Self-pay | Admitting: Obstetrics and Gynecology

## 2015-02-27 ENCOUNTER — Inpatient Hospital Stay (HOSPITAL_COMMUNITY): Payer: PRIVATE HEALTH INSURANCE | Admitting: Anesthesiology

## 2015-02-27 ENCOUNTER — Inpatient Hospital Stay (EMERGENCY_DEPARTMENT_HOSPITAL)
Admission: AD | Admit: 2015-02-27 | Discharge: 2015-02-27 | Disposition: A | Discharge status: Home / Self Care | Payer: PRIVATE HEALTH INSURANCE | Source: Ambulatory Visit | Attending: Obstetrics | Admitting: Obstetrics

## 2015-02-27 ENCOUNTER — Encounter (HOSPITAL_COMMUNITY): Payer: Self-pay | Admitting: Anesthesiology

## 2015-02-27 ENCOUNTER — Inpatient Hospital Stay (HOSPITAL_COMMUNITY)
Admission: AD | Admit: 2015-02-27 | Discharge: 2015-03-01 | DRG: 775 | Disposition: A | Discharge status: Home / Self Care | Payer: PRIVATE HEALTH INSURANCE | Source: Ambulatory Visit | Attending: Obstetrics | Admitting: Obstetrics

## 2015-02-27 ENCOUNTER — Encounter (HOSPITAL_COMMUNITY): Payer: Self-pay | Admitting: *Deleted

## 2015-02-27 ENCOUNTER — Encounter (HOSPITAL_COMMUNITY): Payer: Self-pay | Admitting: Emergency Medicine

## 2015-02-27 DIAGNOSIS — B373 Candidiasis of vulva and vagina: Secondary | ICD-10-CM | POA: Insufficient documentation

## 2015-02-27 DIAGNOSIS — O98813 Other maternal infectious and parasitic diseases complicating pregnancy, third trimester: Secondary | ICD-10-CM

## 2015-02-27 DIAGNOSIS — Z3A36 36 weeks gestation of pregnancy: Secondary | ICD-10-CM

## 2015-02-27 DIAGNOSIS — B3731 Acute candidiasis of vulva and vagina: Secondary | ICD-10-CM

## 2015-02-27 DIAGNOSIS — Z3483 Encounter for supervision of other normal pregnancy, third trimester: Secondary | ICD-10-CM | POA: Diagnosis present

## 2015-02-27 DIAGNOSIS — O4292 Full-term premature rupture of membranes, unspecified as to length of time between rupture and onset of labor: Secondary | ICD-10-CM | POA: Diagnosis present

## 2015-02-27 HISTORY — DX: Postpartum depression: F53.0

## 2015-02-27 HISTORY — DX: Other mental disorders complicating the puerperium: O99.345

## 2015-02-27 LAB — CBC
HEMATOCRIT: 35.5 % — AB (ref 36.0–46.0)
HEMOGLOBIN: 12.3 g/dL (ref 12.0–15.0)
MCH: 30.1 pg (ref 26.0–34.0)
MCHC: 34.6 g/dL (ref 30.0–36.0)
MCV: 86.8 fL (ref 78.0–100.0)
Platelets: 173 10*3/uL (ref 150–400)
RBC: 4.09 MIL/uL (ref 3.87–5.11)
RDW: 13.2 % (ref 11.5–15.5)
WBC: 7.4 10*3/uL (ref 4.0–10.5)

## 2015-02-27 LAB — TYPE AND SCREEN
ABO/RH(D): B POS
Antibody Screen: NEGATIVE

## 2015-02-27 LAB — WET PREP, GENITAL
Clue Cells Wet Prep HPF POC: NONE SEEN
Trich, Wet Prep: NONE SEEN
YEAST WET PREP: NONE SEEN

## 2015-02-27 MED ORDER — FLUCONAZOLE 150 MG PO TABS
150.0000 mg | ORAL_TABLET | Freq: Once | ORAL | Status: AC
  Administered 2015-02-27: 150 mg via ORAL
  Filled 2015-02-27: qty 1

## 2015-02-27 MED ORDER — CITRIC ACID-SODIUM CITRATE 334-500 MG/5ML PO SOLN: 30.0000 mL | ORAL | Status: DC | PRN

## 2015-02-27 MED ORDER — FLEET ENEMA 7-19 GM/118ML RE ENEM: 1.0000 | ENEMA | RECTAL | Status: DC | PRN

## 2015-02-27 MED ORDER — LACTATED RINGERS IV SOLN
INTRAVENOUS | Status: DC
  Administered 2015-02-27 (×2): via INTRAVENOUS

## 2015-02-27 MED ORDER — PENICILLIN G POTASSIUM 5000000 UNITS IJ SOLR
2.5000 10*6.[IU] | INTRAVENOUS | Status: DC
  Administered 2015-02-27: 2.5 10*6.[IU] via INTRAVENOUS
  Filled 2015-02-27 (×6): qty 2.5

## 2015-02-27 MED ORDER — EPHEDRINE 5 MG/ML INJ
10.0000 mg | INTRAVENOUS | Status: DC | PRN
  Filled 2015-02-27: qty 2

## 2015-02-27 MED ORDER — PHENYLEPHRINE 40 MCG/ML (10ML) SYRINGE FOR IV PUSH (FOR BLOOD PRESSURE SUPPORT)
80.0000 ug | PREFILLED_SYRINGE | INTRAVENOUS | Status: DC | PRN
  Filled 2015-02-27: qty 20
  Filled 2015-02-27: qty 2

## 2015-02-27 MED ORDER — LACTATED RINGERS IV SOLN: 500.0000 mL | INTRAVENOUS | Status: DC | PRN

## 2015-02-27 MED ORDER — ACETAMINOPHEN 325 MG PO TABS: 650.0000 mg | ORAL_TABLET | ORAL | Status: DC | PRN

## 2015-02-27 MED ORDER — OXYCODONE-ACETAMINOPHEN 5-325 MG PO TABS
2.0000 | ORAL_TABLET | ORAL | Status: DC | PRN
Start: 2015-02-27 — End: 2015-02-28

## 2015-02-27 MED ORDER — OXYTOCIN 40 UNITS IN LACTATED RINGERS INFUSION - SIMPLE MED
1.0000 m[IU]/min | INTRAVENOUS | Status: DC
Start: 2015-02-27 — End: 2015-02-28
  Administered 2015-02-27: 2 m[IU]/min via INTRAVENOUS
  Filled 2015-02-27: qty 1000

## 2015-02-27 MED ORDER — OXYTOCIN 40 UNITS IN LACTATED RINGERS INFUSION - SIMPLE MED
62.5000 mL/h | INTRAVENOUS | Status: DC
  Administered 2015-02-27: 62.5 mL/h via INTRAVENOUS

## 2015-02-27 MED ORDER — FENTANYL 2.5 MCG/ML BUPIVACAINE 1/10 % EPIDURAL INFUSION (WH - ANES): 14.0000 mL/h | INTRAMUSCULAR | Status: DC | PRN

## 2015-02-27 MED ORDER — PENICILLIN G POTASSIUM 5000000 UNITS IJ SOLR
5.0000 10*6.[IU] | Freq: Once | INTRAVENOUS | Status: AC
  Administered 2015-02-27: 5 10*6.[IU] via INTRAVENOUS
  Filled 2015-02-27: qty 5

## 2015-02-27 MED ORDER — ONDANSETRON HCL 4 MG/2ML IJ SOLN: 4.0000 mg | Freq: Four times a day (QID) | INTRAMUSCULAR | Status: DC | PRN

## 2015-02-27 MED ORDER — BUTORPHANOL TARTRATE 1 MG/ML IJ SOLN: 1.0000 mg | INTRAMUSCULAR | Status: DC | PRN

## 2015-02-27 MED ORDER — OXYCODONE-ACETAMINOPHEN 5-325 MG PO TABS
1.0000 | ORAL_TABLET | ORAL | Status: DC | PRN
Start: 2015-02-27 — End: 2015-02-28

## 2015-02-27 MED ORDER — LIDOCAINE HCL (PF) 1 % IJ SOLN
INTRAMUSCULAR | Status: DC | PRN
  Administered 2015-02-27: 6 mL
  Administered 2015-02-27: 4 mL

## 2015-02-27 MED ORDER — OXYTOCIN BOLUS FROM INFUSION
500.0000 mL | INTRAVENOUS | Status: DC
  Administered 2015-02-27: 500 mL via INTRAVENOUS

## 2015-02-27 MED ORDER — LIDOCAINE HCL (PF) 1 % IJ SOLN
30.0000 mL | INTRAMUSCULAR | Status: DC | PRN
  Filled 2015-02-27: qty 30

## 2015-02-27 MED ORDER — DIPHENHYDRAMINE HCL 50 MG/ML IJ SOLN: 12.5000 mg | INTRAMUSCULAR | Status: DC | PRN

## 2015-02-27 MED ORDER — TERBUTALINE SULFATE 1 MG/ML IJ SOLN: 0.2500 mg | Freq: Once | INTRAMUSCULAR | Status: AC | PRN

## 2015-02-27 MED ORDER — FENTANYL 2.5 MCG/ML BUPIVACAINE 1/10 % EPIDURAL INFUSION (WH - ANES)
14.0000 mL/h | INTRAMUSCULAR | Status: DC | PRN
  Administered 2015-02-27: 14 mL/h via EPIDURAL
  Filled 2015-02-27: qty 125

## 2015-02-27 NOTE — Discharge Instructions (Signed)
Braxton Hicks Contractions °Contractions of the uterus can occur throughout pregnancy. Contractions are not always a sign that you are in labor.  °WHAT ARE BRAXTON HICKS CONTRACTIONS?  °Contractions that occur before labor are called Braxton Hicks contractions, or false labor. Toward the end of pregnancy (32-34 weeks), these contractions can develop more often and may become more forceful. This is not true labor because these contractions do not result in opening (dilatation) and thinning of the cervix. They are sometimes difficult to tell apart from true labor because these contractions can be forceful and people have different pain tolerances. You should not feel embarrassed if you go to the hospital with false labor. Sometimes, the only way to tell if you are in true labor is for your health care provider to look for changes in the cervix. °If there are no prenatal problems or other health problems associated with the pregnancy, it is completely safe to be sent home with false labor and await the onset of true labor. °HOW CAN YOU TELL THE DIFFERENCE BETWEEN TRUE AND FALSE LABOR? °False Labor °· The contractions of false labor are usually shorter and not as hard as those of true labor.   °· The contractions are usually irregular.   °· The contractions are often felt in the front of the lower abdomen and in the groin.   °· The contractions may go away when you walk around or change positions while lying down.   °· The contractions get weaker and are shorter lasting as time goes on.   °· The contractions do not usually become progressively stronger, regular, and closer together as with true labor.   °True Labor °· Contractions in true labor last 30-70 seconds, become very regular, usually become more intense, and increase in frequency.   °· The contractions do not go away with walking.   °· The discomfort is usually felt in the top of the uterus and spreads to the lower abdomen and low back.   °· True labor can be  determined by your health care provider with an exam. This will show that the cervix is dilating and getting thinner.   °WHAT TO REMEMBER °· Keep up with your usual exercises and follow other instructions given by your health care provider.   °· Take medicines as directed by your health care provider.   °· Keep your regular prenatal appointments.   °· Eat and drink lightly if you think you are going into labor.   °· If Braxton Hicks contractions are making you uncomfortable:   °¨ Change your position from lying down or resting to walking, or from walking to resting.   °¨ Sit and rest in a tub of warm water.   °¨ Drink 2-3 glasses of water. Dehydration may cause these contractions.   °¨ Do slow and deep breathing several times an hour.   °WHEN SHOULD I SEEK IMMEDIATE MEDICAL CARE? °Seek immediate medical care if: °· Your contractions become stronger, more regular, and closer together.   °· You have fluid leaking or gushing from your vagina.   °· You have a fever.   °· You pass blood-tinged mucus.   °· You have vaginal bleeding.   °· You have continuous abdominal pain.   °· You have low back pain that you never had before.   °· You feel your baby's head pushing down and causing pelvic pressure.   °· Your baby is not moving as much as it used to.   °Document Released: 10/04/2005 Document Revised: 10/09/2013 Document Reviewed: 07/16/2013 °[employer]® Patient Information ©2015 [employer]. This information is not intended to replace advice given to you by your health care   provider. Make sure you discuss any questions you have with your health care provider. ° °

## 2015-02-27 NOTE — MAU Note (Signed)
Notified Dr. Chestine Sporelark patient SROM around 12 clear fluid, no contractions, cervix 1/70/-3, fhr reassuring unknown GBS, MD to enter admit orders.

## 2015-02-27 NOTE — MAU Note (Signed)
Pt presents to MAU with complaints of some vaginal bleeding and discharge this morning Reports no discharge of bleeding now. Denies any contractions.

## 2015-02-27 NOTE — MAU Provider Note (Signed)
History     CSN: [id number]  Arrival date and time: 02/27/15 1317   None     Chief Complaint  Patient presents with  . Rupture of Membranes   Vaginal Discharge The patient's primary symptoms include vaginal discharge. The patient's pertinent negatives include no genital itching, genital lesions, genital odor or vaginal bleeding. This is a new problem. The current episode started today. The problem occurs constantly. The patient is experiencing no pain. She is pregnant. Pertinent negatives include no abdominal pain, back pain, chills, fever, headaches, nausea or vomiting. The vaginal discharge was clear and watery. There has been no bleeding. She has not been passing clots. She has not been passing tissue. Nothing aggravates the symptoms. She has tried nothing for the symptoms.   This is a [age] y.o. female at 5771w0d who presents with c/o leaking fluid.  States was walking around and felt water break. Not feeling many contractions now. Denies bleeding but did report it earlier today, though exam revealed no bleeding. + fetal movement.    RN Note: Patient was shopping and felt her water break clear fluid.           OB History    Gravida Para Term Preterm AB TAB SAB Ectopic Multiple Living   6 4 4  0 1 1 0 0 0 4      Past Medical History  Diagnosis Date  . Chlamydia   . Pelvic inflammatory disease (PID)     Past Surgical History  Procedure Laterality Date  . Induced abortion      Family History  Problem Relation Age of Onset  . Hypertension Mother   . Lupus Sister   . Sickle cell trait Sister   . Sickle cell trait Brother   . Hypertension Maternal Grandmother   . Heart disease Maternal Grandmother   . Lupus Maternal Grandfather   . Diabetes Maternal Aunt   . Hypertension Maternal Aunt     History  Substance Use Topics  . Smoking status: Never Smoker   . Smokeless tobacco: Never Used  . Alcohol Use: No    Allergies: No Known Allergies  Prescriptions prior  to admission  Medication Sig Dispense Refill Last Dose  . Prenatal Vit-Fe Fumarate-FA (PRENATAL MULTIVITAMIN) TABS tablet Take 1 tablet by mouth daily at 12 noon.   02/26/2015 at Unknown time    Review of Systems  Constitutional: Negative for fever and chills.  Gastrointestinal: Negative for nausea, vomiting and abdominal pain.  Genitourinary: Positive for vaginal discharge.  Musculoskeletal: Negative for back pain.  Neurological: Negative for headaches.   Physical Exam   Blood pressure 126/66, pulse 81, temperature 98.1 F (36.7 C), temperature source Oral, resp. rate 16, last menstrual period 06/11/2014, not currently breastfeeding.  Physical Exam  Constitutional: She is oriented to person, place, and time. She appears well-developed and well-nourished. No distress.  HENT:  Head: Normocephalic.  Cardiovascular: Normal rate, regular rhythm and normal heart sounds.   Respiratory: Effort normal.  GI: Soft. She exhibits no distension and no mass. There is no tenderness. There is no rebound and no guarding.  Genitourinary: Vaginal discharge (watery and mucous-like, sterile spec: minimal pooling, + ferning) found.  Dilation: 1 Effacement (%): 70 Cervical Position: Posterior Station: -3 Presentation: Vertex   Musculoskeletal: Normal range of motion. Edema: trace pedal.  Neurological: She is alert and oriented to person, place, and time.  Skin: Skin is warm and dry.  Psychiatric: She has a normal mood and affect.    MAU  Course  Procedures  MDM Bedside US confirms vertex Speculum exam, Minimal pooling, + ferning  Assessment and Plan  A:  SIUP at 7462w0d        Preterm Premature Rupture of Membranes       Not in labor        P:  RN contacted Dr Chestine Sporelark        [facility]       Routine orders       Dr Chestine Sporelark to discuss plan of care         [facility] CenterWILLIAMS,MARIE 02/27/2015, 2:24 PM

## 2015-02-27 NOTE — MAU Provider Note (Signed)
History     CSN: [id number]  Arrival date and time: 02/27/15 [id number]   First Provider Initiated Contact with Patient 02/27/15 [phone]      Chief Complaint  Patient presents with  . Vaginal Bleeding  . Vaginal Discharge   HPI Ms. Susan [name] is a 29 y.o. [phone] at 4744w0d who presents to MAU today with complaint of vaginal bleeding. The patient states that she noted blood in the toilet when she woke up this morning. She denies previous vaginal bleeding, vaginal discharge or LOF. She states some mucus discharge yesterday. She denies complications with the pregnancy. She denies N/V, but states 3 loose stools yesterday. She denies fever. She states some irregular contractions overnight that have stopped while in MAU. She reports good fetal movement.   OB History    Gravida Para Term Preterm AB TAB SAB Ectopic Multiple Living   6 4 4  0 1 1 0 0 0 4      Past Medical History  Diagnosis Date  . Chlamydia   . Pelvic inflammatory disease (PID)     Past Surgical History  Procedure Laterality Date  . Induced abortion      Family History  Problem Relation Age of Onset  . Hypertension Mother   . Lupus Sister   . Sickle cell trait Sister   . Sickle cell trait Brother   . Hypertension Maternal Grandmother   . Heart disease Maternal Grandmother   . Lupus Maternal Grandfather   . Diabetes Maternal Aunt   . Hypertension Maternal Aunt     History  Substance Use Topics  . Smoking status: Never Smoker   . Smokeless tobacco: Never Used  . Alcohol Use: No    Allergies: No Known Allergies  Prescriptions prior to admission  Medication Sig Dispense Refill Last Dose  . Prenatal Vit-Fe Fumarate-FA (PRENATAL MULTIVITAMIN) TABS tablet Take 1 tablet by mouth daily at 12 noon.   02/26/2015 at Unknown time    Review of Systems  Constitutional: Negative for fever and malaise/fatigue.  Gastrointestinal: Negative for nausea, vomiting, abdominal pain, diarrhea and constipation.   Genitourinary: Negative for dysuria, urgency and frequency.       + vaginal bleeding Neg - vaginal discharge, LOF   Physical Exam   Blood pressure 123/81, pulse 117, temperature 100.2 F (37.9 C), resp. rate 18, last menstrual period 06/11/2014, not currently breastfeeding.  Physical Exam  Nursing note and vitals reviewed. Constitutional: She is oriented to person, place, and time. She appears well-developed and well-nourished. No distress.  HENT:  Head: Normocephalic and atraumatic.  Cardiovascular: Normal rate.   Respiratory: Effort normal.  GI: Soft. She exhibits no distension and no mass. There is no tenderness. There is no rebound and no guarding.  Genitourinary: Uterus is enlarged. Uterus is not tender. Cervix exhibits no motion tenderness, no discharge and no friability. No bleeding in the vagina. Vaginal discharge (moderat amount of thick, clumpy discharge noted) found.  Neurological: She is alert and oriented to person, place, and time.  Skin: Skin is warm and dry. No erythema.  Psychiatric: She has a normal mood and affect.   Results for orders placed or performed during the hospital encounter of 02/27/15 (from the past 24 hour(s))  Wet prep, genital     Status: Abnormal   Collection Time: 02/27/15  9:28 AM  Result Value Ref Range   Yeast Wet Prep HPF POC NONE SEEN NONE SEEN   Trich, Wet Prep NONE SEEN NONE SEEN   Clue Cells  Wet Prep HPF POC NONE SEEN NONE SEEN   WBC, Wet Prep HPF POC FEW (A) NONE SEEN   Fetal Monitoring: Baseline: 140 bpm, moderate variability, + accelerations, no decelerations Contractions: none  MAU Course  Procedures None  MDM Discussed patient with Dr. Chestine Sporelark. Agrees with plan to treat for clinical yeast vulvovaginitis. Patient can follow-up as scheduled in the office  Assessment and Plan  A: SIUP at 5033w0d Yeast vulvovagintiis  P: Discharge home Treated with Diflucan in MAU Labor precautions discussed Patient [facility] Patient may return to MAU as needed or if her condition were to change or worsen   Marny LowensteinJulie N Kiersten Coss, PA-C  02/27/2015, 9:21 AM

## 2015-02-27 NOTE — Anesthesia Preprocedure Evaluation (Signed)

## 2015-02-27 NOTE — Progress Notes (Signed)
Positive fern slide obtained by Wynelle BourgeoisMarie Williams CNM, vertex presentation verified by bedside US by Wynelle BourgeoisMarie Williams CNM

## 2015-02-27 NOTE — MAU Note (Signed)
Patient was shopping and felt her water break clear fluid.

## 2015-02-27 NOTE — Anesthesia Procedure Notes (Signed)

## 2015-02-27 NOTE — H&P (Signed)
29 y.o. [id number] @ 5317w0d presents with c/o large gush of fluid while shopping.  Denies vb, but was seen at MAU earlier today for c/o bleeding.   On PE, she was r/in w pooling and + fern.   Otherwise has good fetal movement and no ctx.  Pregnancy c/b:  1. Late PNC: dated by 22 wk US 2. Bilateral axillary abscesses: I&D of left abscess, Right axillary fluid collection: aspirated on 4/19, received abx.  DId not keep appt to f/u w general surgery.  d Past Medical History  Diagnosis Date  . Chlamydia   . Pelvic inflammatory disease (PID)     Past Surgical History  Procedure Laterality Date  . Induced abortion      OB History  Gravida Para Term Preterm AB SAB TAB Ectopic Multiple Living  6 4 4  0 1 0 1 0 0 4    # Outcome Date GA Lbr Len/2nd Weight Sex Delivery Anes PTL Lv  6 Current           5 Term 06/11/13 7763w1d 06:26 / 00:30 2.89 kg (6 lb 5.9 oz) F Vag-Spont EPI  Y  4 Term 04/06/10 5647w0d  2.722 kg (6 lb) M Vag-Spont Spinal  Y  3 TAB 2011          2 Term 06/08/08 793w0d  3.487 kg (7 lb 11 oz) M Vag-Spont None  Y  1 Term 08/03/07 3370w6d  2.722 kg (6 lb) F Vag-Spont Spinal  Y      History   Social History  . Marital Status: Single    Spouse Name: N/A  . Number of Children: N/A  . Years of Education: N/A   Occupational History  . Not on file.   Social History Main Topics  . Smoking status: Never Smoker   . Smokeless tobacco: Never Used  . Alcohol Use: No  . Drug Use: No  . Sexual Activity: Yes   Other Topics Concern  . Not on file   Social History Narrative   Review of patient's allergies indicates no known allergies.    Prenatal Transfer Tool  Maternal Diabetes: No Genetic Screening: Declined Maternal Ultrasounds/Referrals: Normal Fetal Ultrasounds or other Referrals:  None Maternal Substance Abuse:  No Significant Maternal Medications:  Meds include: Other:  Prozac mid-pregnancy, not currently Significant Maternal Lab Results: None    Filed Vitals:   02/27/15  1439  BP: 127/78  Pulse: 80  Temp: 98.6 F (37 C)  Resp:      General:  NAD Lungs: CTAB Cardiac: RRR Abdomen:  soft, gravid, EFW 6.5# Ex:  no edema SVE:  1/70/-3 per RN FHTs:  130s, mod var Cat 1 Toco:  irregular   A/P   29 y.o. 117w0d  [id number] presents with premature rupture of membranes IOL now w pitocin PCN for GBS unknown (collected 5/10, not yet resulted).   Epidural upon maternal request. FSR/ vtx  Susan [name] Susan [name] Susan [name]

## 2015-02-28 ENCOUNTER — Encounter (HOSPITAL_COMMUNITY): Payer: Self-pay | Admitting: *Deleted

## 2015-02-28 LAB — ABO/RH: ABO/RH(D): B POS

## 2015-02-28 LAB — CBC
HCT: 31.9 % — ABNORMAL LOW (ref 36.0–46.0)
Hemoglobin: 11.1 g/dL — ABNORMAL LOW (ref 12.0–15.0)
MCH: 30 pg (ref 26.0–34.0)
MCHC: 34.8 g/dL (ref 30.0–36.0)
MCV: 86.2 fL (ref 78.0–100.0)
PLATELETS: 153 10*3/uL (ref 150–400)
RBC: 3.7 MIL/uL — ABNORMAL LOW (ref 3.87–5.11)
RDW: 13.2 % (ref 11.5–15.5)
WBC: 8.6 10*3/uL (ref 4.0–10.5)

## 2015-02-28 LAB — RPR: RPR Ser Ql: NONREACTIVE

## 2015-02-28 MED ORDER — SENNOSIDES-DOCUSATE SODIUM 8.6-50 MG PO TABS
2.0000 | ORAL_TABLET | ORAL | Status: DC
  Administered 2015-02-28: 2 via ORAL
  Filled 2015-02-28: qty 2

## 2015-02-28 MED ORDER — BENZOCAINE-MENTHOL 20-0.5 % EX AERO: 1.0000 "application " | INHALATION_SPRAY | CUTANEOUS | Status: DC | PRN

## 2015-02-28 MED ORDER — IBUPROFEN 600 MG PO TABS
600.0000 mg | ORAL_TABLET | Freq: Four times a day (QID) | ORAL | Status: DC
  Administered 2015-02-28 – 2015-03-01 (×5): 600 mg via ORAL
  Filled 2015-02-28 (×4): qty 1

## 2015-02-28 MED ORDER — DIBUCAINE 1 % RE OINT: 1.0000 "application " | TOPICAL_OINTMENT | RECTAL | Status: DC | PRN

## 2015-02-28 MED ORDER — OXYCODONE-ACETAMINOPHEN 5-325 MG PO TABS: 1.0000 | ORAL_TABLET | ORAL | Status: DC | PRN

## 2015-02-28 MED ORDER — SIMETHICONE 80 MG PO CHEW: 80.0000 mg | CHEWABLE_TABLET | ORAL | Status: DC | PRN

## 2015-02-28 MED ORDER — DIPHENHYDRAMINE HCL 25 MG PO CAPS: 25.0000 mg | ORAL_CAPSULE | Freq: Four times a day (QID) | ORAL | Status: DC | PRN

## 2015-02-28 MED ORDER — ACETAMINOPHEN 325 MG PO TABS: 650.0000 mg | ORAL_TABLET | ORAL | Status: DC | PRN

## 2015-02-28 MED ORDER — PRENATAL MULTIVITAMIN CH
1.0000 | ORAL_TABLET | Freq: Every day | ORAL | Status: DC
  Administered 2015-02-28: 1 via ORAL
  Filled 2015-02-28: qty 1

## 2015-02-28 MED ORDER — WITCH HAZEL-GLYCERIN EX PADS: 1.0000 "application " | MEDICATED_PAD | CUTANEOUS | Status: DC | PRN

## 2015-02-28 MED ORDER — TETANUS-DIPHTH-ACELL PERTUSSIS 5-2.5-18.5 LF-MCG/0.5 IM SUSP: 0.5000 mL | Freq: Once | INTRAMUSCULAR | Status: DC

## 2015-02-28 MED ORDER — ONDANSETRON HCL 4 MG/2ML IJ SOLN: 4.0000 mg | INTRAMUSCULAR | Status: DC | PRN

## 2015-02-28 MED ORDER — OXYCODONE-ACETAMINOPHEN 5-325 MG PO TABS
2.0000 | ORAL_TABLET | ORAL | Status: DC | PRN
  Administered 2015-02-28: 2 via ORAL
  Filled 2015-02-28: qty 2

## 2015-02-28 MED ORDER — LANOLIN HYDROUS EX OINT: TOPICAL_OINTMENT | CUTANEOUS | Status: DC | PRN

## 2015-02-28 MED ORDER — ONDANSETRON HCL 4 MG PO TABS: 4.0000 mg | ORAL_TABLET | ORAL | Status: DC | PRN

## 2015-02-28 NOTE — Anesthesia Postprocedure Evaluation (Signed)
  Anesthesia Post-op Note  Patient: Susan [name]  Procedure(s) Performed: * No procedures listed *  Patient Location: Mother/Baby  Anesthesia Type:Epidural  Level of Consciousness: awake  Airway and Oxygen Therapy: Patient Spontanous Breathing  Post-op Pain: mild  Post-op Assessment: Patient's Cardiovascular Status Stable and Respiratory Function Stable  Post-op Vital Signs: stable  Last Vitals:  Filed Vitals:   02/28/15 0601  BP: 117/68  Pulse: 54  Temp: 36.3 C  Resp: 18    Complications: No apparent anesthesia complications

## 2015-02-28 NOTE — Discharge Summary (Signed)
Obstetric Discharge Summary Reason for Admission: onset of labor Prenatal Procedures: none Intrapartum Procedures: spontaneous vaginal delivery Postpartum Procedures: none Complications-Operative and Postpartum: none HEMOGLOBIN  Date Value Ref Range Status  02/28/2015 11.1* 12.0 - 15.0 g/dL Final   HCT  Date Value Ref Range Status  02/28/2015 31.9* 36.0 - 46.0 % Final    Discharge Diagnoses: Term Pregnancy-delivered  Discharge Information: Date: 02/28/2015 Activity: pelvic rest Diet: routine Medications: Ibuprofen Condition: stable  Instructions: refer to practice specific booklet Discharge to: home Follow-up Information    [facility] In 4 weeks.   Specialty:  Obstetrics   Contact information:   [address] [place] [address] [phone]       Newborn Data: Live born female  Birth Weight: 5 lb 15.5 oz (2707 g) APGAR: 8, 9  Home with mother.  Cabell Lazenby A 02/28/2015, 8:04 AM

## 2015-02-28 NOTE — Progress Notes (Signed)
CSW acknowledges consult for history of postpartum depression.   CSW attempted to meet with MOB but she had numerous visitors in her room.  MOB agreeable to visit at another time.   CSW to follow up.  

## 2015-02-28 NOTE — Progress Notes (Signed)
Parents desire circ in office. 

## 2015-02-28 NOTE — Lactation Note (Signed)
This note was copied from the chart of Susan [name] Stiverson. Lactation Consultation Note  Patient Name: Susan [name] Mandich VWUJW'JToday's Date: 02/28/2015 Reason for consult: Initial assessment;Late preterm infant;Infant < 6lbs RN set up DEBP earlier today for Mom to use. At this visit, Mom just gave supplement of formula via bottle did not put baby to breast. Company present. Mom reports BF older children for about 1 week. LC asked Mom her plans for this baby and she reports wanting to BF till she return to work in about 6 weeks. LC stressed to Mom the importance of BF with each feeding before giving any supplements to encourage milk production. Reviewed LPT behaviors and advised to limit time at breast to 30 minutes to limit calorie usage, supplement according to guidelines in LPT policy. Mom prefer to use bottles.  Mom has hand out. Encouraged to pump every 3 hours for 15 minutes to encourage milk production. Lactation brochure left for review, advised of OP services and support group. Encouraged Mom to call with next feeding for LC to observe latch, Mom reports some mild nipple tenderness. Advised to apply EBM.   Maternal Data Does the patient have breastfeeding experience prior to this delivery?: Yes  Feeding Feeding Type: Formula  LATCH Score/Interventions                Intervention(s): Breastfeeding basics reviewed;Skin to skin     Lactation Tools Discussed/Used Tools: Pump Breast pump type: Double-Electric Breast Pump WIC Program: No   Consult Status Consult Status: Follow-up Date: 03/01/15 Follow-up type: In-patient    Alfred LevinsGranger, Malisha Mabey Ann 02/28/2015, 3:27 PM

## 2015-03-01 ENCOUNTER — Encounter (HOSPITAL_COMMUNITY): Payer: Self-pay | Admitting: *Deleted

## 2015-03-01 NOTE — Lactation Note (Signed)
This note was copied from the chart of Boy Margreta JourneyMachael Holzmann. Lactation Consultation Note  Patient Name: Boy Margreta JourneyMachael Anglin ZOXWR'UToday's Date: 03/01/2015 Reason for consult: Follow-up assessment   With this mom of a now 36 2/[redacted] week gestation baby, weighing 5 lbs 10 oz. Mom areports she mostly formula feeds her babies, but does some breast feeding. This is her first pre term baby, so I reviewed how this baby will not protect her supply well, and that she should be pumping if she wants to provide EBm for her baby. I reviewed hand expression with mom - mom very tender, difficult to express small drops. Mom just at 35 hours post partum. I gave mom a manual hand pump, and showed her how to use it. After this, it was easy to express a large drop of colostrum. I told mom about our 2 week rental program, and advised her to call her insurance company for a DEP. Mom very receptive to teaching. Mom's nurse, courtney, is reviewing formula prep and care with mom. Mom was advised to call lactation for questions/concerns.   Maternal Data    Feeding Feeding Type: Breast Fed  LATCH Score/Interventions                      Lactation Tools Discussed/Used Breast pump type: Manual Pump Review: Setup, frequency, and cleaning;Other (comment) (hand expression)   Consult Status Consult Status: Complete Follow-up type: Call as needed    Alfred LevinsLee, Kriste Broman Anne 03/01/2015, 10:30 AM

## 2015-03-01 NOTE — Progress Notes (Deleted)
  Patient is eating, ambulating, voiding.  Pain control is good.  Filed Vitals:   02/28/15 0202 02/28/15 0601 02/28/15 1752 03/01/15 0558  BP: 120/50 117/68 130/71 119/86  Pulse: 63 54 73 59  Temp: 98.5 F (36.9 C) 97.3 F (36.3 C) 98.4 F (36.9 C) 98.4 F (36.9 C)  TempSrc: Oral Oral Axillary Oral  Resp: 18 18 18 18   Height:      Weight:      SpO2:    100%    lungs:   clear to auscultation cor:    RRR Abdomen:  soft, appropriate tenderness, incisions intact and without erythema or exudate ex:    no cords   Lab Results  Component Value Date   WBC 8.6 02/28/2015   HGB 11.1* 02/28/2015   HCT 31.9* 02/28/2015   MCV 86.2 02/28/2015   PLT 153 02/28/2015    --/--/B POS, B POS (05/12 1726)/RI  A/P    Post operative day 0.  Routine post op and postpartum care.  Expect d/c routine.  Percocet for pain control.

## 2015-03-01 NOTE — Clinical Social Work Maternal (Signed)
  CLINICAL SOCIAL WORK MATERNAL/CHILD NOTE  Patient Details  Name: Oneita JollyMachael M Hofland MRN: [id number] Date of Birth: [birth year]  Date:  03/01/2015  Clinical Social Worker Initiating Note:  Johnnye Lanaumi Caidyn Blossom, LCSW Date/ Time Initiated:  03/01/15/1100     Child's Name:  Daivd CouncilMason Fitzpatrick   Legal Guardian:   (Parents Lonnie Luan PullingFitzpatrick and Margreta JourneyMachael Canova)   Need for Interpreter:  None   Date of Referral:  02/27/15     Reason for Referral:  Other (Comment)   [facility]   Address:  [address]  [place], [place] [address]  Phone number:   [phone])   Household Members:  Significant Other, Minor Children   Natural Supports (not living in the home):  Extended Family, Friends, Spouse/significant other   Professional Supports: None   Employment: Full-time   Type of Work:     Education:      Architectinancial Resources:  Media plannerrivate Insurance   Other Resources:      Cultural/Religious Considerations Which May Impact Care: none reported   Strengths:  Ability to meet basic needs , Home prepared for child , Pediatrician chosen    Risk Factors/Current Problems:  None   Cognitive State:  Alert    Mood/Affect:  Calm , Happy    CSW Assessment:  Acknowledged order for social work consult to assess mother's hx of PP Depression.   MOB was pleasant and receptive to social work intervention.  Parents have 4 other dependents ages 807, 356, 474 and 1.    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   Informed that she was prescribed medication, but never took them and the symptoms eventually subsided.  She reports no other hx of mental illness.   She denies any current symptoms of anxiety or depression.  She also denies any hx of illicit drug use.   No acute social concerns noted or reported at this time.  Mother informed of social work Surveyor, miningavailability.  CSW Plan/Description:  No Further Intervention Required/No Barriers to Discharge, Information/Referral to [facility]  Resources   PP Depression information and resources   Keerthana Vanrossum J, LCSW 03/01/2015, 4:28 PM

## 2015-03-01 NOTE — Progress Notes (Signed)
Patient is eating, ambulating, voiding.  Pain control is good.  Filed Vitals:   02/28/15 0202 02/28/15 0601 02/28/15 1752 03/01/15 0558  BP: 120/50 117/68 130/71 119/86  Pulse: 63 54 73 59  Temp: 98.5 F (36.9 C) 97.3 F (36.3 C) 98.4 F (36.9 C) 98.4 F (36.9 C)  TempSrc: Oral Oral Axillary Oral  Resp: 18 18 18 18   Height:      Weight:      SpO2:    100%    Fundus firm Perineum without swelling.  Lab Results  Component Value Date   WBC 8.6 02/28/2015   HGB 11.1* 02/28/2015   HCT 31.9* 02/28/2015   MCV 86.2 02/28/2015   PLT 153 02/28/2015    --/--/B POS, B POS (05/12 1726)/RI  A/P Post partum day 2.  Routine care.  Expect d/c today.    Stormy Connon A

## 2015-07-07 ENCOUNTER — Encounter (HOSPITAL_COMMUNITY): Payer: Self-pay

## 2015-07-07 ENCOUNTER — Emergency Department (HOSPITAL_COMMUNITY)
Admission: EM | Admit: 2015-07-07 | Discharge: 2015-07-07 | Disposition: A | Discharge status: Home / Self Care | Payer: PRIVATE HEALTH INSURANCE | Attending: Emergency Medicine | Admitting: Emergency Medicine

## 2015-07-07 ENCOUNTER — Emergency Department (HOSPITAL_COMMUNITY): Payer: PRIVATE HEALTH INSURANCE

## 2015-07-07 DIAGNOSIS — M79644 Pain in right finger(s): Secondary | ICD-10-CM

## 2015-07-07 DIAGNOSIS — Y998 Other external cause status: Secondary | ICD-10-CM | POA: Insufficient documentation

## 2015-07-07 DIAGNOSIS — Z79899 Other long term (current) drug therapy: Secondary | ICD-10-CM | POA: Diagnosis not present

## 2015-07-07 DIAGNOSIS — Y9389 Activity, other specified: Secondary | ICD-10-CM | POA: Diagnosis not present

## 2015-07-07 DIAGNOSIS — Z8742 Personal history of other diseases of the female genital tract: Secondary | ICD-10-CM | POA: Diagnosis not present

## 2015-07-07 DIAGNOSIS — S6991XA Unspecified injury of right wrist, hand and finger(s), initial encounter: Secondary | ICD-10-CM | POA: Diagnosis present

## 2015-07-07 DIAGNOSIS — Y9289 Other specified places as the place of occurrence of the external cause: Secondary | ICD-10-CM | POA: Insufficient documentation

## 2015-07-07 DIAGNOSIS — Z8619 Personal history of other infectious and parasitic diseases: Secondary | ICD-10-CM | POA: Insufficient documentation

## 2015-07-07 MED ORDER — NAPROXEN 250 MG PO TABS
250.0000 mg | ORAL_TABLET | Freq: Two times a day (BID) | ORAL | Status: DC
Start: 2015-07-07 — End: 2016-10-13

## 2015-07-07 NOTE — Discharge Instructions (Signed)
Finger Sprain  A finger sprain is a tear in one of the strong, fibrous tissues that connect the bones (ligaments) in your finger. The severity of the sprain depends on how much of the ligament is torn. The tear can be either partial or complete.  CAUSES   Often, sprains are a result of a fall or accident. If you extend your hands to catch an object or to protect yourself, the force of the impact causes the fibers of your ligament to stretch too much. This excess tension causes the fibers of your ligament to tear.  SYMPTOMS   You may have some loss of motion in your finger. Other symptoms include:   Bruising.   Tenderness.   Swelling.  DIAGNOSIS   In order to diagnose finger sprain, your caregiver will physically examine your finger or thumb to determine how torn the ligament is. Your caregiver may also suggest an X-ray exam of your finger to make sure no bones are broken.  TREATMENT   If your ligament is only partially torn, treatment usually involves keeping the finger in a fixed position (immobilization) for a short period. To do this, your caregiver will apply a bandage, cast, or splint to keep your finger from moving until it heals. For a partially torn ligament, the healing process usually takes 2 to 3 weeks.  If your ligament is completely torn, you may need surgery to reconnect the ligament to the bone. After surgery a cast or splint will be applied and will need to stay on your finger or thumb for 4 to 6 weeks while your ligament heals.  HOME CARE INSTRUCTIONS   Keep your injured finger elevated, when possible, to decrease swelling.   To ease pain and swelling, apply ice to your joint twice a day, for 2 to 3 days:   Put ice in a plastic bag.   Place a towel between your skin and the bag.   Leave the ice on for 15 minutes.   Only take over-the-counter or prescription medicine for pain as directed by your caregiver.   Do not wear rings on your injured finger.   Do not leave your finger unprotected  until pain and stiffness go away (usually 3 to 4 weeks).   Do not allow your cast or splint to get wet. Cover your cast or splint with a plastic bag when you shower or bathe. Do not swim.   Your caregiver may suggest special exercises for you to do during your recovery to prevent or limit permanent stiffness.  SEEK IMMEDIATE MEDICAL CARE IF:   Your cast or splint becomes damaged.   Your pain becomes worse rather than better.  MAKE SURE YOU:   Understand these instructions.   Will watch your condition.   Will get help right away if you are not doing well or get worse.  Document Released: 11/11/2004 Document Revised: 12/27/2011 Document Reviewed: 06/07/2011  ExitCare Patient Information 2015 [employer]. This information is not intended to replace advice given to you by your health care provider. Make sure you discuss any questions you have with your health care provider.

## 2015-07-07 NOTE — ED Notes (Signed)
Ace bandage to rt hand. Good cms
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
## 2016-10-13 ENCOUNTER — Ambulatory Visit
Admission: EM | Admit: 2016-10-13 | Discharge: 2016-10-13 | Disposition: A | Discharge status: Home / Self Care | Payer: PRIVATE HEALTH INSURANCE | Attending: Internal Medicine | Admitting: Internal Medicine

## 2016-10-13 DIAGNOSIS — A599 Trichomoniasis, unspecified: Secondary | ICD-10-CM

## 2016-10-13 LAB — URINALYSIS, COMPLETE (UACMP) WITH MICROSCOPIC
BILIRUBIN URINE: NEGATIVE
GLUCOSE, UA: NEGATIVE mg/dL
NITRITE: NEGATIVE
PH: 7.5 (ref 5.0–8.0)
Specific Gravity, Urine: 1.02 (ref 1.005–1.030)

## 2016-10-13 LAB — CHLAMYDIA/NGC RT PCR (ARMC ONLY)
CHLAMYDIA TR: NOT DETECTED
N GONORRHOEAE: NOT DETECTED

## 2016-10-13 LAB — WET PREP, GENITAL
Clue Cells Wet Prep HPF POC: NONE SEEN
SPERM: NONE SEEN
Yeast Wet Prep HPF POC: NONE SEEN

## 2016-10-13 LAB — PREGNANCY, URINE: Preg Test, Ur: NEGATIVE

## 2016-10-13 MED ORDER — METRONIDAZOLE 500 MG PO TABS
2000.0000 mg | ORAL_TABLET | Freq: Once | ORAL | Status: AC
  Administered 2016-10-13: 2000 mg via ORAL

## 2016-10-13 NOTE — ED Triage Notes (Signed)
Pt c/o vaginal discharge that has an odor for the last 2 weeks. She has tried over the [employer] monistat, however it didn't help.

## 2016-10-13 NOTE — Discharge Instructions (Signed)
Practice safe sex. All partners need to be informed and treated. No sexual activity for at least one week and until all partners treated and follow up has taken place.   Follow up with your primary care physician or [facility] in one week. Return to Urgent care for new or worsening concerns.

## 2016-10-13 NOTE — ED Provider Notes (Signed)
[facility] ____________________________________________  Time seen: Approximately 10:40 AM  I have reviewed the triage vital signs and the nursing notes.   HISTORY  Chief Complaint Vaginal Discharge  HPI Susan [name] is a 30 y.o. female presenting for evaluation of 2-2.5 weeks of vaginal discharge. Patient reports initially she had some vaginal itching associated with the discharge, but used over-the-counter Monistat which cleared the itching. Patient reports vaginal discharge has continued. Describing vaginal discharge as whitish yellowish discharge. Patient denies any pelvic or vaginal pain. Denies abdominal pain. Denies urinary frequency, urinary urgency or burning with urination. Patient reports that she did have some right-sided pain, none now, in which she correlates with not drinking as much water a few days ago. Denies current pain.  Patient reports that she is sexually active with one partner which is the same partner for approximately 6 months. Declines high concerns of STDs, but consents to gonorrhea and chlamydia testing. Declines HIV or other STD testing. Denies pregnancy. Reports last menstrual was last week. Denies fevers. Reports continues to eat and drink well and remain active.   Patient's last menstrual period was 09/22/2016.  Past Medical History:  Diagnosis Date  . Chlamydia   . Pelvic inflammatory disease (PID)   . Post partum depression    Hx of    Patient Active Problem List   Diagnosis Date Noted  . Full-term premature rupture of membranes 02/27/2015  . 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 Rubella non-immune status 02/21/2013  . Obesity 02/21/2013  . Supervision of normal subsequent pregnancy 02/21/2013    Past Surgical History:  Procedure Laterality Date  . INDUCED ABORTION        No current facility-administered medications for this encounter.  No current outpatient prescriptions on  file.  Allergies Patient has no known allergies.  Family History  Problem Relation Age of Onset  . Hypertension Mother   . Lupus Sister   . Sickle cell trait Sister   . Hypertension Maternal Grandmother   . Heart disease Maternal Grandmother   . Lupus Maternal Grandfather   . Sickle cell trait Brother   . Diabetes Maternal Aunt   . Hypertension Maternal Aunt     Social History Social History  Substance Use Topics  . Smoking status: Never Smoker  . Smokeless tobacco: Never Used  . Alcohol use No    Review of Systems Constitutional: No fever/chills Eyes: No visual changes. ENT: No sore throat. Cardiovascular: Denies chest pain. Respiratory: Denies shortness of breath. Gastrointestinal: No abdominal pain.  No nausea, no vomiting.  No diarrhea.  No constipation. Genitourinary: Negative for dysuria. Musculoskeletal: Negative for back pain. Skin: Negative for rash. Neurological: Negative for headaches, focal weakness or numbness.  10-point ROS otherwise negative.  ____________________________________________   PHYSICAL EXAM:  VITAL SIGNS: ED Triage Vitals  Enc Vitals Group     BP 10/13/16 1037 125/88     Pulse Rate 10/13/16 1037 68     Resp 10/13/16 1037 18     Temp 10/13/16 1037 98 F (36.7 C)     Temp Source 10/13/16 1037 Oral     SpO2 10/13/16 1037 99 %     Weight 10/13/16 1036 179 lb (81.2 kg)     Height 10/13/16 1036 5\' 4"  (1.626 m)     Head Circumference --      Peak Flow --      Pain Score 10/13/16 1038 3     Pain Loc --  Pain Edu? --      Excl. in GC? --     Constitutional: Alert and oriented. Well appearing and in no acute distress. Eyes: Conjunctivae are normal. PERRL. EOMI. ENT      Head: Normocephalic and atraumatic.      Mouth/Throat: Mucous membranes are moist.Oropharynx non-erythematous. Cardiovascular: Normal rate, regular rhythm. Grossly normal heart sounds.  Good peripheral circulation. Respiratory: Normal respiratory effort  without tachypnea nor retractions. Breath sounds are clear and equal bilaterally. No wheezes/rales/rhonchi.. Gastrointestinal: Soft and nontender. No distention.  No CVA tenderness. Pelvic: completed with Jacki ConesLaurie RN at bedside as chaperone.  External: normal appearance, no rash or lesion. Speculum: moderate amount greenish discharge, no bleeding, no foreign bodies, cervical os closed. Bimanual: nontender, no cervical or adnexal tenderness.  Musculoskeletal:  Ambulatory with steady gait. No midline cervical, thoracic or lumbar tenderness to palpation.  Neurologic:  Normal speech and language. Speech is normal. No gait instability.  Skin:  Skin is warm, dry and intact. No rash noted. Psychiatric: Mood and affect are normal. Speech and behavior are normal. Patient exhibits appropriate insight and judgment   ___________________________________________   LABS (all labs ordered are listed, but only abnormal results are displayed)  Labs Reviewed  WET PREP, GENITAL - Abnormal; Notable for the following:       Result Value   Trich, Wet Prep PRESENT (*)    WBC, Wet Prep HPF POC TOO NUMEROUS TO COUNT (*)    All other components within normal limits  URINALYSIS, COMPLETE (UACMP) WITH MICROSCOPIC - Abnormal; Notable for the following:    APPearance HAZY (*)    Hgb urine dipstick TRACE (*)    Ketones, ur TRACE (*)    Protein, ur TRACE (*)    Leukocytes, UA MODERATE (*)    Squamous Epithelial / LPF 6-30 (*)    Bacteria, UA RARE (*)    All other components within normal limits  CHLAMYDIA/NGC RT PCR ([facility] ONLY)  URINE CULTURE  PREGNANCY, URINE   ____________________________________________   PROCEDURES Procedures     INITIAL IMPRESSION / ASSESSMENT AND PLAN / ED COURSE  Pertinent labs & imaging results that were available during my care of the patient were reviewed by me and considered in my medical decision making (see chart for details).  Well-appearing patient. No acute  distress. Presents for vaginal discharge. Discussed in detail with patient, will evaluate by pelvic exam.    Urinalysis and labs reviewed. Discussed in detail with patient urinalysis results, and in absence of dysuria, concern for contaminated result and will await urine culture prior to initiating oral antibiotics. Wet prep positive for trichomonas. 2 g oral Flagyl given once in urgent care. Discussed in detail with patient on partner to be treated, safe sex practices, no sex for at least 1 week and until all partners treated and follow-up to ensure clearance. Information also given for [facility] [place] [facility]. Discussed indication, risks and benefits of medications with patient. Patient declines prophylactic treatment of gonorrhea and chlamydia at this time, and will await results.    Discussed follow up with Primary care physician this week. Discussed follow up and return parameters including no resolution or any worsening concerns. Patient verbalized understanding and agreed to plan.   ____________________________________________   FINAL CLINICAL IMPRESSION(S) / ED DIAGNOSES  Final diagnoses:  Trichomoniasis     New Prescriptions   No medications on file    Note: This dictation was prepared with Dragon dictation along with smaller phrase technology. Any transcriptional errors  that result from this process are unintentional.    Clinical Course       Renford Dills, NP 10/13/16 1154

## 2016-10-14 LAB — URINE CULTURE

## 2017-05-09 ENCOUNTER — Encounter: Payer: Self-pay | Admitting: Physician Assistant

## 2017-05-09 ENCOUNTER — Ambulatory Visit (INDEPENDENT_AMBULATORY_CARE_PROVIDER_SITE_OTHER): Payer: 59 | Admitting: Physician Assistant

## 2017-05-09 VITALS — BP 124/75 | HR 56 | Temp 98.0°F | Resp 18 | Ht 65.75 in | Wt 167.8 lb

## 2017-05-09 DIAGNOSIS — R82998 Other abnormal findings in urine: Secondary | ICD-10-CM

## 2017-05-09 DIAGNOSIS — R8299 Other abnormal findings in urine: Secondary | ICD-10-CM

## 2017-05-09 DIAGNOSIS — A5901 Trichomonal vulvovaginitis: Secondary | ICD-10-CM | POA: Diagnosis not present

## 2017-05-09 DIAGNOSIS — N760 Acute vaginitis: Secondary | ICD-10-CM | POA: Diagnosis not present

## 2017-05-09 DIAGNOSIS — N898 Other specified noninflammatory disorders of vagina: Secondary | ICD-10-CM

## 2017-05-09 DIAGNOSIS — B9689 Other specified bacterial agents as the cause of diseases classified elsewhere: Secondary | ICD-10-CM

## 2017-05-09 LAB — POC MICROSCOPIC URINALYSIS (UMFC): Trichomonas: POSITIVE

## 2017-05-09 LAB — POCT WET + KOH PREP
YEAST BY KOH: ABSENT
Yeast by wet prep: ABSENT

## 2017-05-09 LAB — POCT URINALYSIS DIP (MANUAL ENTRY)
Bilirubin, UA: NEGATIVE
Blood, UA: NEGATIVE
Glucose, UA: NEGATIVE mg/dL
Ketones, POC UA: NEGATIVE mg/dL
Nitrite, UA: NEGATIVE
PROTEIN UA: NEGATIVE mg/dL
SPEC GRAV UA: 1.025 (ref 1.010–1.025)
UROBILINOGEN UA: 0.2 U/dL
pH, UA: 7.5 (ref 5.0–8.0)

## 2017-05-09 MED ORDER — METRONIDAZOLE 500 MG PO TABS: 500.0000 mg | ORAL_TABLET | Freq: Two times a day (BID) | ORAL | 0 refills | Status: DC

## 2017-05-09 NOTE — Patient Instructions (Addendum)
Your results show that you have both bacterial vaginosis and trichomonas. BV is not a STD but trichomonas is. You will be treated with flagyl twice daily for 7 days. Do not consume alcohol while on this medication. Do not engage in sexual activity until you complete the course of medication. Please notify your sexual partners that you have an STD as they will also need to get tested and treated.   Cervicitis Cervicitis is when the cervix gets irritated and swollen. Your cervix is the lower end of your uterus. Follow these instructions at home:  Do not have sex until your doctor says it is okay.  Take over-the-counter and prescription medicines only as told by your doctor.  If you were prescribed an antibiotic medicine, take it as told by your doctor. Do not stop taking it even if you start to feel better.  Keep all follow-up visits as told by your doctor. This is important. Contact a doctor if:  Your symptoms come back after treatment.  Your symptoms get worse after treatment.  You have a fever.  You feel tired (fatigued).  Your belly (abdomen) hurts.  You feel like you are going to throw up (are nauseous).  You throw up (vomit).  You have watery poop (diarrhea).  Your back hurts. Get help right away if:  You have very bad pain in your belly, and medicine does not help it.  You cannot pee (urinate). Summary  Cervicitis is when the cervix gets irritated and swollen.  Do not have sex until your doctor says it is okay.  If you need to take an antibiotic, do not stop taking even if you start to feel better. Take medicines only as told by your doctor. This information is not intended to replace advice given to you by your health care provider. Make sure you discuss any questions you have with your health care provider. Document Released: 07/13/2008 Document Revised: 06/20/2016 Document Reviewed: 06/20/2016 Elsevier Interactive Patient Education  2017 Elsevier  Inc.    Bacterial Vaginosis Bacterial vaginosis is an infection of the vagina. It happens when too many germs (bacteria) grow in the vagina. This infection puts you at risk for infections from sex (STIs). Treating this infection can lower your risk for some STIs. You should also treat this if you are pregnant. It can cause your baby to be born early. Follow these instructions at home: Medicines  Take over-the-counter and prescription medicines only as told by your doctor.  Take or use your antibiotic medicine as told by your doctor. Do not stop taking or using it even if you start to feel better. General instructions  If you your sexual partner is a woman, tell her that you have this infection. She needs to get treatment if she has symptoms. If you have a female partner, he does not need to be treated.  During treatment: ? Avoid sex. ? Do not douche. ? Avoid alcohol as told. ? Avoid breastfeeding as told.  Drink enough fluid to keep your pee (urine) clear or pale yellow.  Keep your vagina and butt (rectum) clean. ? Wash the area with warm water every day. ? Wipe from front to back after you use the toilet.  Keep all follow-up visits as told by your doctor. This is important. Preventing this condition  Do not douche.  Use only warm water to wash around your vagina.  Use protection when you have sex. This includes: ? Latex condoms. ? Dental dams.  Limit how many  people you have sex with. It is best to only have sex with the same person (be monogamous).  Get tested for STIs. Have your partner get tested.  Wear underwear that is cotton or lined with cotton.  Avoid tight pants and pantyhose. This is most important in summer.  Do not use any products that have nicotine or tobacco in them. These include cigarettes and e-cigarettes. If you need help quitting, ask your doctor.  Do not use illegal drugs.  Limit how much alcohol you drink. Contact a doctor if:  Your symptoms  do not get better, even after you are treated.  You have more discharge or pain when you pee (urinate).  You have a fever.  You have pain in your belly (abdomen).  You have pain with sex.  Your bleed from your vagina between periods. Summary  This infection happens when too many germs (bacteria) grow in the vagina.  Treating this condition can lower your risk for some infections from sex (STIs).  You should also treat this if you are pregnant. It can cause early (premature) birth.  Do not stop taking or using your antibiotic medicine even if you start to feel better. This information is not intended to replace advice given to you by your health care provider. Make sure you discuss any questions you have with your health care provider. Document Released: 07/13/2008 Document Revised: 06/19/2016 Document Reviewed: 06/19/2016 Elsevier Interactive Patient Education  2017 Elsevier Inc.  Preventing Cervical Cancer Cervical cancer is cancer that grows on the cervix. The cervix is at the bottom of the uterus. It connects the uterus to the vagina. The uterus is where a baby develops during pregnancy. Cancer occurs when cells become abnormal and start to grow out of control. Cervical cancer grows slowly and may not cause any symptoms at first. Over time, the cancer can grow deep into the cervix tissue and spread to other areas. If it is found early, cervical cancer can be treated effectively. You can also take steps to prevent this type of cancer. Most cases of cervical cancer are caused by an STI (sexually transmitted infection) called human papillomavirus (HPV). One way to reduce your risk of cervical cancer is to avoid infection with the HPV virus. You can do this by practicing safe sex and by getting the HPV vaccine. Getting regular Pap tests is also important because this can help identify changes in cells that could lead to cancer. Your chances of getting this disease can also be reduced by  making certain lifestyle changes. How can I protect myself from cervical cancer? Preventing HPV infection  Ask your health care provider about getting the HPV vaccine. If you are 29 years old or younger, you may need to get this vaccine, which is given in three doses over 6 months. This vaccine protects against the types of HPV that could cause cancer.  Limit the number of people you have sex with. Also avoid having sex with people who have had many sex partners.  Use a latex condom during sex. Getting Pap tests  Get Pap tests regularly, starting at age 27. Talk with your health care provider about how often you need these tests. ? Most women who are 71?29 years of age should have a Pap test every 3 years. ? Most women who are 50?29 years of age should have a Pap test in combination with an HPV test every 5 years. ? Women with a higher risk of cervical cancer, such as those  with a weakened immune system or those who have been exposed to the drug diethylstilbestrol (DES), may need more frequent testing. Making other lifestyle changes  Do not use any products that contain nicotine or tobacco, such as cigarettes and e-cigarettes. If you need help quitting, ask your health care provider.  Eat at least 5 servings of fruits and vegetables every day.  Lose weight if you are overweight. Why are these changes important?  These changes and screening tests are designed to address the factors that are known to increase the risk of cervical cancer. Taking these steps is the best way to reduce your risk.  Having regular Pap tests will help identify changes in cells that could lead to cancer. Steps can then be taken to prevent cancer from developing.  These changes will also help find cervical cancer early. This type of cancer can be treated effectively if it is found early. It can be more dangerous and difficult to treat if cancer has grown deep into your cervix or has spread.  In addition to making  you less likely to get cervical cancer, these changes will also provide other health benefits, such as the following: ? Practicing safe sex is important for preventing STIs and unplanned pregnancies. ? Avoiding tobacco can reduce your risk for other cancers and health issues. ? Eating a healthy diet and maintaining a healthy weight are good for your overall health. What can happen if changes are not made? In the early stages, cervical cancer might not have any symptoms. It can take many years for the cancer to grow and get deep into the cervix tissue. This may be happening without you knowing about it. If you develop any symptoms, such as pelvic pain or unusual discharge or bleeding from your vagina, you should see your health care provider right away. If cervical cancer is not found early, you might need treatments such as radiation, chemotherapy, or surgery. In some cases, surgery may mean that you will not be able to get pregnant or carry a pregnancy to term. Where to find support: Talk with your health care provider, school nurse, or local health department for guidance about screening and vaccination. Some children and teens may be able to get the HPV vaccine free of charge through the U.S. government's Vaccines for Children [facility]) program. Other places that provide vaccinations include:  Public health clinics. Check with your local health department.  Federally [employer], where you would pay only what you can afford. To find one near you, check this website: http://weiss.info/  [facility]ics. These are part of a program for Medicare and Medicaid patients who live in rural areas.  The National Breast and Cervical Cancer Early Detection Program also provides breast and cervical cancer screenings and diagnostic services to low-income, uninsured, and underinsured women. Cervical cancer can be passed down through families. Talk with your health care provider or genetic  counselor to learn more about genetic testing for cancer. Where to find more information: Learn more about cervical cancer from:  [employer]ne[employer]: HardChecks.be  American Cancer Society: www.cancer.org/cancer/cervicalcancer/  U.S. Centers for Disease Control and Prevention: MedicationWarning.tn  Summary  Talk with your health care provider about getting the HPV vaccine.  Be sure to get regular Pap tests as recommended by your health care provider.  See your health care provider right away if you have any pelvic pain or unusual discharge or bleeding from your vagina. This information is not intended to replace advice given  to you by your health care provider. Make sure you discuss any questions you have with your health care provider. Document Released: 10/19/2015 Document Revised: 06/01/2016 Document Reviewed: 06/01/2016 Elsevier Interactive Patient Education  2018 [employer].   IF you received an x-ray today, you will receive an invoice from [facility]. Please contact [facility] at [phone] with questions or concerns regarding your invoice.   IF you received labwork today, you will receive an invoice from [facility]. Please contact [facility] at [phone] with questions or concerns regarding your invoice.   Our billing staff will not be able to assist you with questions regarding bills from these companies.  You will be contacted with the lab results as soon as they are available. The fastest way to get your results is to activate your My Chart account. Instructions are located on the last page of this paperwork. If you have not heard from [place] regarding the results in 2 weeks, please contact this office.

## 2017-05-09 NOTE — Progress Notes (Signed)
Susan JollyMachael M Bozzi  MRN: [id number] DOB: 29-Jun-1986  Subjective:  Susan [name] is a 31 y.o. female seen in office today for a chief complaint of vaginal discharge x 5 days. Denies vaginal itching, vaginal pain, odor, dysuria, urinary frequency, urinary urgency, hematuria, pelvic pain, and abdominal pain. LMP was 04/27/17. Pt is sexually active with one partner. Uses condoms. Last STD was 2 months ago, notes she was treated for trichomonas at that time. Has no other questions or concerns.   Review of Systems  Constitutional: Negative for chills, diaphoresis and fever.  Gastrointestinal: Negative for nausea and vomiting.  Genitourinary: Negative for flank pain and genital sores.    Patient Active Problem List   Diagnosis Date Noted  . Full-term premature rupture of membranes 02/27/2015  . 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 Rubella non-immune status 02/21/2013  . Obesity 02/21/2013  . Supervision of normal subsequent pregnancy 02/21/2013    No current outpatient prescriptions on file prior to visit.   No current facility-administered medications on file prior to visit.     No Known Allergies    Social History   Social History  . Marital status: Single    Spouse name: N/A  . Number of children: N/A  . Years of education: N/A   Occupational History  . Not on file.   Social History Main Topics  . Smoking status: Never Smoker  . Smokeless tobacco: Never Used  . Alcohol use No  . Drug use: No  . Sexual activity: Yes   Other Topics Concern  . Not on file   Social History Narrative  . No narrative on file    Objective:  BP 124/75 (BP Location: Right Arm, Patient Position: Sitting, Cuff Size: Normal)   Pulse (!) 56   Temp 98 F (36.7 C) (Oral)   Resp 18   Ht 5' 5.75" (1.67 m)   Wt 167 lb 12.8 oz (76.1 kg)   LMP 04/27/2017 (Exact Date)   SpO2 100%   Breastfeeding? No   BMI 27.29 kg/m   Physical Exam    Constitutional: She is oriented to person, place, and time and well-developed, well-nourished, and in no distress.  HENT:  Head: Normocephalic and atraumatic.  Eyes: Conjunctivae are normal.  Neck: Normal range of motion.  Pulmonary/Chest: Effort normal.  Abdominal: There is no tenderness.  Genitourinary: Uterus normal, cervix normal, right adnexa normal, left adnexa normal and vulva normal. Uterus is not tender. Right adnexum displays no tenderness. Left adnexum displays no tenderness. Creamy  white  yellow and vaginal discharge found.  Neurological: She is alert and oriented to person, place, and time. Gait normal.  Skin: Skin is warm and dry.  Psychiatric: Affect normal.  Vitals reviewed.  Results for orders placed or performed in visit on 05/09/17 (from the past 24 hour(s))  POCT Wet + KOH Prep     Status: Abnormal   Collection Time: 05/09/17 12:56 PM  Result Value Ref Range   Yeast by KOH Absent Absent   Yeast by wet prep Absent Absent   WBC by wet prep Too numerous to count  (A) Few   Clue Cells Wet Prep HPF POC Moderate (A) None   Trich by wet prep Present (A) Absent   Bacteria Wet Prep HPF POC Many (A) Few   Epithelial Cells By [employer]et Pref ([facility]) Few None, Few, Too numerous to count   RBC,UR,HPF,POC None None RBC/hpf  POCT urinalysis dipstick  Status: Abnormal   Collection Time: 05/09/17 12:57 PM  Result Value Ref Range   Color, UA yellow yellow   Clarity, UA clear clear   Glucose, UA negative negative mg/dL   Bilirubin, UA negative negative   Ketones, POC UA negative negative mg/dL   Spec Grav, UA 1.610 9.604 - 1.025   Blood, UA negative negative   pH, UA 7.5 5.0 - 8.0   Protein Ur, POC negative negative mg/dL   Urobilinogen, UA 0.2 0.2 or 1.0 E.U./dL   Nitrite, UA Negative Negative   Leukocytes, UA Large (3+) (A) Negative     Assessment and Plan :  1. Vaginal discharge - POCT urinalysis dipstick - POCT Wet + KOH Prep - GC/Chlamydia Probe Amp - POCT  Microscopic Urinalysis ([facility]) 2. Trichomonas vaginitis Wet prep shows trichomonas and clue cells. Will tx with flagyl. Pt encouraged to tell sexual partners that she is being treated for an STD and have them seek care to be treated. Avoid sexual activity while being treated.  - metroNIDAZOLE (FLAGYL) 500 MG tablet; Take 1 tablet (500 mg total) by mouth 2 (two) times daily with a meal. DO NOT CONSUME ALCOHOL WHILE TAKING THIS MEDICATION.  Dispense: 14 tablet; Refill: 0 3. Bacterial vaginosis - metroNIDAZOLE (FLAGYL) 500 MG tablet; Take 1 tablet (500 mg total) by mouth 2 (two) times daily with a meal. DO NOT CONSUME ALCOHOL WHILE TAKING THIS MEDICATION.  Dispense: 14 tablet; Refill: 0  4. Leukocytes in urine No urinary symptoms. Urine culture pending.  - Urine Culture  Benjiman Core, PA-C  Primary Care at [facility] 05/09/2017 1:53 PM

## 2017-05-11 LAB — GC/CHLAMYDIA PROBE AMP
Chlamydia trachomatis, NAA: NEGATIVE
NEISSERIA GONORRHOEAE BY PCR: NEGATIVE

## 2017-05-11 LAB — URINE CULTURE: Organism ID, Bacteria: NO GROWTH

## 2017-06-16 ENCOUNTER — Encounter: Payer: 59 | Admitting: Obstetrics and Gynecology

## 2017-07-25 ENCOUNTER — Encounter: Payer: Self-pay | Admitting: Obstetrics and Gynecology

## 2017-10-05 ENCOUNTER — Encounter: Payer: Self-pay | Admitting: Obstetrics and Gynecology

## 2017-12-30 ENCOUNTER — Encounter: Payer: Self-pay | Admitting: Physician Assistant

## 2017-12-30 ENCOUNTER — Ambulatory Visit: Admitting: Physician Assistant

## 2017-12-30 ENCOUNTER — Other Ambulatory Visit: Payer: Self-pay

## 2017-12-30 VITALS — BP 126/82 | HR 100 | Temp 99.2°F | Ht 64.0 in | Wt 168.0 lb

## 2017-12-30 DIAGNOSIS — T148XXA Other injury of unspecified body region, initial encounter: Secondary | ICD-10-CM

## 2017-12-30 DIAGNOSIS — S71159A Open bite, unspecified thigh, initial encounter: Secondary | ICD-10-CM

## 2017-12-30 MED ORDER — AMOXICILLIN-POT CLAVULANATE 875-125 MG PO TABS: 1.0000 | ORAL_TABLET | Freq: Two times a day (BID) | ORAL | 0 refills | Status: AC

## 2017-12-30 NOTE — Patient Instructions (Addendum)
  Please take antibiotics as prescribed. Follow up on Monday for reevaluation. Return sooner if any of your symptoms worsen. Thank you for letting me participate in your health and well being.  Animal Bite Animal bite wounds can get infected. It is important to get proper medical treatment. Ask your doctor if you need rabies treatment. Follow these instructions at home: Wound care  Follow instructions from your doctor about how to take care of your wound. Make sure you: ? Wash your hands with soap and water before you change your bandage (dressing). If you cannot use soap and water, use hand sanitizer. ? Change your bandage as told by your doctor. ? Leave stitches (sutures), skin glue, or skin tape (adhesive) strips in place. They may need to stay in place for 2 weeks or longer. If tape strips get loose and curl up, you may trim the loose edges. Do not remove tape strips completely unless your doctor says it is okay.  Check your wound every day for signs of infection. Watch for: ? Redness, swelling, or pain that gets worse. ? Fluid, blood, or pus. General instructions  Take or apply over-the-counter and prescription medicines only as told by your doctor.  If you were prescribed an antibiotic, take or apply it as told by your doctor. Do not stop using the antibiotic even if your condition improves.  Keep the injured area raised (elevated) above the level of your heart while you are sitting or lying down.  If directed, apply ice to the injured area. ? Put ice in a plastic bag. ? Place a towel between your skin and the bag. ? Leave the ice on for 20 minutes, 2-3 times per day.  Keep all follow-up visits as told by your doctor. This is important. Contact a doctor if:  You have redness, swelling, or pain that gets worse.  You have a general feeling of sickness (malaise).  You feel sick to your stomach (nauseous).  You throw up (vomit).  You have pain that does not get better. Get  help right away if:  You have a red streak going away from your wound.  You have fluid, blood, or pus coming from your wound.  You have a fever or chills.  You have trouble moving your injured area.  You have numbness or tingling anywhere on your body. This information is not intended to replace advice given to you by your health care provider. Make sure you discuss any questions you have with your health care provider. Document Released: 10/04/2005 Document Revised: 03/11/2016 Document Reviewed: 02/19/2015 Elsevier Interactive Patient Education  2018 [employer]    IF you received an x-ray today, you will receive an invoice from [facility]. Please contact [facility] at [phone] with questions or concerns regarding your invoice.   IF you received labwork today, you will receive an invoice from [facility]. Please contact [facility] at [phone] with questions or concerns regarding your invoice.   Our billing staff will not be able to assist you with questions regarding bills from these companies.  You will be contacted with the lab results as soon as they are available. The fastest way to get your results is to activate your My Chart account. Instructions are located on the last page of this paperwork. If you have not heard from us regarding the results in 2 weeks, please contact this office.

## 2017-12-30 NOTE — Progress Notes (Addendum)
    MRN: [id number] DOB: 01-04-1986  Subjective:   Susan [name] is a 32 y.o. 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 Bit on both legs. Not sure if the animals were up to dates on vaccine. Incident took place on the 13th of March)  Was delivering packages at doorstep 2 days ago. While resident was opening the door to pick up the packages, his 2 house dogs escaped from the house and "attacked" pt. States she felt a couple bites on the back off her legs and that was it. The resident was pretty sure the dogs were up to date on vaccines. Pt's supervisor is further investigating that. Since injury, pt has not thoroughly cleansed the wounds. She has associated pain. Denies redness, warmth, and purulent drainage. Has not tried anything for relief. She is up to date on Tdap vaccine (last received 05/2013). Denies smoking. No hx of T2DM. Has no other questions or concerns.   Review of Systems  Constitutional: Negative for chills, diaphoresis and fever.  Gastrointestinal: Negative for nausea and vomiting.      [employer]'s medications list, allergies, past medical history and past surgical history were reviewed and excluded from this note due to being a worker's comp case.   Objective:   Vitals: BP 126/82 (BP Location: Left Arm, Patient Position: Sitting, Cuff Size: Normal)   Pulse 100   Temp 99.2 F (37.3 C) (Oral)   Ht 5\' 4"  (1.626 m)   Wt 168 lb (76.2 kg)   LMP 12/10/2017   SpO2 100%   BMI 28.84 kg/m   Physical Exam  Constitutional: She is oriented to person, place, and time. She appears well-developed and well-nourished.  HENT:  Head: Normocephalic and atraumatic.  Eyes: Conjunctivae are normal.  Neck: Normal range of motion.  Pulmonary/Chest: Effort normal.  Neurological: She is alert and oriented to person, place, and time.  Skin: Skin is warm and dry.     Psychiatric: She has a normal mood and affect.  Vitals  reviewed.   No results found for this or any previous visit (from the past 24 hour(s)).        Wound care: Affected areas were thoroughly cleanses with soap and water using sterile gauze, soap, and warm water. Band aid applied.  Assessment and Plan :  1. Animal bite of thigh, unspecified laterality, initial encounter Pt has experienced minor wounds from dog bite. No systemic symptoms. Will treat empirically with augmentin at this time. She is up to date on Tdap. Dogs were house pets and were most likely vaccinated, but animal control will be investigating this further, therefore no indication for rabies PEP at this time. Pt is overall well appearing, no distress. Considering the wounds are minor and she is otherwise asymptomatic, she can be released to work full time at this time with no restrictions. Recommend follow up 3 days to ensure appropriate healing. Wound care instructions given. Given strict return precautions. - amoxicillin-clavulanate (AUGMENTIN) 875-125 MG tablet; Take 1 tablet by mouth 2 (two) times daily for 10 days.  Dispense: 20 tablet; Refill: 0  2. Avulsion of skin 3. Abrasion of skin  Benjiman CoreBrittany Alaisa Moffitt, PA-C  Primary Care at [facility] 12/30/2017 2:16 PM

## 2017-12-31 ENCOUNTER — Encounter: Payer: Self-pay | Admitting: Physician Assistant

## 2018-01-02 ENCOUNTER — Other Ambulatory Visit: Payer: Self-pay

## 2018-01-02 ENCOUNTER — Encounter: Payer: Self-pay | Admitting: Physician Assistant

## 2018-01-02 ENCOUNTER — Ambulatory Visit: Admitting: Physician Assistant

## 2018-01-02 VITALS — BP 120/78 | HR 97 | Temp 98.0°F | Resp 18 | Ht 66.14 in | Wt 169.0 lb

## 2018-01-02 DIAGNOSIS — S71159A Open bite, unspecified thigh, initial encounter: Secondary | ICD-10-CM

## 2018-01-02 DIAGNOSIS — T148XXA Other injury of unspecified body region, initial encounter: Secondary | ICD-10-CM

## 2018-01-02 NOTE — Patient Instructions (Addendum)
Please pick up the antibiotic and take as prescribed. Start washing the wound with soap and water. Keep bandaid over the area. Follow up on Friday for reevaluation or sooner if symptoms worsen. Thank you for letting me participate in your health and well being.     IF you received an x-ray today, you will receive an invoice from [facility]. Please contact [facility] at [phone] with questions or concerns regarding your invoice.   IF you received labwork today, you will receive an invoice from [facility]. Please contact [facility] at [phone] with questions or concerns regarding your invoice.   Our billing staff will not be able to assist you with questions regarding bills from these companies.  You will be contacted with the lab results as soon as they are available. The fastest way to get your results is to activate your My Chart account. Instructions are located on the last page of this paperwork. If you have not heard from us regarding the results in 2 weeks, please contact this office.

## 2018-01-02 NOTE — Progress Notes (Signed)
    MRN: [id number] DOB: 1986-02-23  Subjective:   Susan [name] is a 32 y.o. female presenting for chief complaint of Animal Bite (f/u)  Last seen on 12/30/17 for animal bite to posterior bilateral thighs. Wound was cleansed with soap and warm water. Given Rx for abx. Today, she reports that she has not yet picked the antibiotic from the pharmacy yet because she just got the form of payment to pick it up under workers comp. Therefore, has not taken any medication yet.  Has not been cleansing wound with soap and water. Has kept it covered with a bandaid. Wound on right leg is more painful today. Pain is aggravated slightly by sitting and more so by walking. Denies fever, chills, purulent drainage, nausea, and vomiting.  No other questions or concerns today.   Susan [name]'s medications list, allergies, past medical history and past surgical history were reviewed and excluded from this note due to being a worker's comp case.   Objective:   Vitals: BP 120/78 (BP Location: Left Arm, Patient Position: Sitting, Cuff Size: Normal)   Pulse 97   Temp 98 F (36.7 C) (Oral)   Resp 18   Ht 5' 6.14" (1.68 m)   Wt 169 lb (76.7 kg)   LMP 12/10/2017   SpO2 100%   BMI 27.16 kg/m   Physical Exam  Constitutional: She is oriented to person, place, and time. She appears well-developed and well-nourished.  HENT:  Head: Normocephalic and atraumatic.  Eyes: Conjunctivae are normal.  Neck: Normal range of motion.  Pulmonary/Chest: Effort normal.  Neurological: She is alert and oriented to person, place, and time.  Skin: Skin is warm and dry.     Psychiatric: She has a normal mood and affect.  Vitals reviewed.   No results found for this or any previous visit (from the past 24 hour(s)).  Assessment and Plan :  1. Animal bite of thigh, unspecified laterality, initial encounter 2. Avulsion of skin 3. Abrasion of skin No change in appearance to left leg wound. Right leg wound appears to be worsening  as it has more erythema and tenderness compared to initial visit. Stressed the importance of picking up and starting antibiotic today and daily cleansing of wound. She is overall well appearing, no distress. Vitals stable. Plan to follow up later this week for reevaluation or sooner if symptoms worsen/develops new concerning sx.   Benjiman CoreBrittany Damondre Pfeifle, PA-C  Primary Care at [facility] 01/02/2018 9:10 AM

## 2018-01-06 ENCOUNTER — Encounter: Payer: Self-pay | Admitting: Physician Assistant

## 2018-01-06 ENCOUNTER — Ambulatory Visit: Payer: Self-pay | Admitting: Physician Assistant

## 2018-01-06 ENCOUNTER — Other Ambulatory Visit: Payer: Self-pay

## 2018-01-06 ENCOUNTER — Ambulatory Visit: Admitting: Physician Assistant

## 2018-01-06 VITALS — BP 126/78 | HR 63 | Temp 99.1°F | Resp 18 | Ht 66.3 in | Wt 168.6 lb

## 2018-01-06 DIAGNOSIS — T148XXA Other injury of unspecified body region, initial encounter: Secondary | ICD-10-CM

## 2018-01-06 DIAGNOSIS — S71159D Open bite, unspecified thigh, subsequent encounter: Secondary | ICD-10-CM

## 2018-01-06 NOTE — Patient Instructions (Addendum)
Start washing the wound with soap and water daily. Continue antibiotics. Follow up in 6 days for reevalution. Return sooner if symptoms worsen.     IF you received an x-ray today, you will receive an invoice from [facility]. Please contact [facility] at [phone] with questions or concerns regarding your invoice.   IF you received labwork today, you will receive an invoice from [facility]. Please contact [facility] at [phone] with questions or concerns regarding your invoice.   Our billing staff will not be able to assist you with questions regarding bills from these companies.  You will be contacted with the lab results as soon as they are available. The fastest way to get your results is to activate your My Chart account. Instructions are located on the last page of this paperwork. If you have not heard from us regarding the results in 2 weeks, please contact this office.

## 2018-01-06 NOTE — Progress Notes (Signed)
   Susan JollyMachael M Dillion  MRN: [id number] DOB: Apr 06, 1986  Subjective:  Susan [name] is a 32 y.o. female seen in office today for a chief complaint of follow up on animal bite to posterior bilateral thighs. Last seen on 01/02/18. At that appointment, she had not yet picked up the antibiotic prescribed at the initial visit. Encouraged to do so. Today, she reports she picked up the antibiotic and has been taking it as prescribed.  Has not been cleansing wound with soap and water. Has kept it covered with a bandaid. Wound on left leg is not bothering her anymore. Wound on right is still slightly painful.  Denies fever, chills, purulent drainage, nausea, and vomiting.  No other questions or concerns today.   Review of Systems Per HPI Patient Active Problem List   Diagnosis Date Noted  . Full-term premature rupture of membranes 02/27/2015  . 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 Rubella non-immune status 02/21/2013  . Obesity 02/21/2013  . Supervision of normal subsequent pregnancy 02/21/2013    Current Outpatient Medications on File Prior to Visit  Medication Sig Dispense Refill  . amoxicillin-clavulanate (AUGMENTIN) 875-125 MG tablet Take 1 tablet by mouth 2 (two) times daily for 10 days. 20 tablet 0  . metroNIDAZOLE (FLAGYL) 500 MG tablet Take 1 tablet (500 mg total) by mouth 2 (two) times daily with a meal. DO NOT CONSUME ALCOHOL WHILE TAKING THIS MEDICATION. (Patient not taking: Reported on 12/30/2017) 14 tablet 0   No current facility-administered medications on file prior to visit.     No Known Allergies   Objective:  BP 126/78 (BP Location: Right Arm, Patient Position: Sitting, Cuff Size: Normal)   Pulse 63   Temp 99.1 F (37.3 C) (Oral)   Resp 18   Ht 5' 6.3" (1.684 m)   Wt 168 lb 9.6 oz (76.5 kg)   LMP 12/10/2017   SpO2 99%   BMI 26.97 kg/m   Physical Exam  Constitutional: She is oriented to person, place, and time and well-developed,  well-nourished, and in no distress.  HENT:  Head: Normocephalic and atraumatic.  Eyes: Conjunctivae are normal.  Neck: Normal range of motion.  Pulmonary/Chest: Effort normal.  Neurological: She is alert and oriented to person, place, and time. Gait normal.  Skin: Skin is warm and dry.  One ~0.75 cm avulsion noted on posterior right thigh.  Normal granulation tissue noted.  No erythema or induration noted.  No purulent drainage noted.  Mild tenderness with palpation.  One ~0.25cm well-healing scab on left posterior thigh.  No surrounding erythema, induration, ecchymosis, warmth, or purulent drainage noted.  Psychiatric: Affect normal.  Vitals reviewed.  Wound care: Wound cleansed with alcohol pad.  Mupirocin ointment applied to right posterior thigh wound.  Band-Aid applied. Assessment and Plan :  1. Animal bite of thigh, unspecified laterality, subsequent encounter 2. Avulsion of skin 3. Abrasion of skin Healing appropriately. Encouraged to continue antibiotics until they are complete.  Start washing the wound with soap and water every day.  Keep covered with Band-Aid.  Follow-up in 6 days for reevaluation or sooner if symptoms worsen or develop new concerning symptoms.    Benjiman CoreBrittany Burnett Lieber PA-C  Primary Care at [facility] 01/06/2018 1:51 PM

## 2018-01-12 ENCOUNTER — Ambulatory Visit: Payer: Self-pay | Admitting: Physician Assistant

## 2019-06-24 ENCOUNTER — Emergency Department (HOSPITAL_COMMUNITY)
Admission: EM | Admit: 2019-06-24 | Discharge: 2019-06-25 | Disposition: A | Discharge status: Home / Self Care | Payer: 59 | Attending: Emergency Medicine | Admitting: Emergency Medicine

## 2019-06-24 ENCOUNTER — Encounter (HOSPITAL_COMMUNITY): Payer: Self-pay

## 2019-06-24 ENCOUNTER — Other Ambulatory Visit: Payer: Self-pay

## 2019-06-24 DIAGNOSIS — W2210XA Striking against or struck by unspecified automobile airbag, initial encounter: Secondary | ICD-10-CM | POA: Insufficient documentation

## 2019-06-24 DIAGNOSIS — Y999 Unspecified external cause status: Secondary | ICD-10-CM | POA: Diagnosis not present

## 2019-06-24 DIAGNOSIS — Y93I9 Activity, other involving external motion: Secondary | ICD-10-CM | POA: Insufficient documentation

## 2019-06-24 DIAGNOSIS — M545 Low back pain: Secondary | ICD-10-CM | POA: Insufficient documentation

## 2019-06-24 DIAGNOSIS — M79641 Pain in right hand: Secondary | ICD-10-CM | POA: Diagnosis not present

## 2019-06-24 DIAGNOSIS — Y92410 Unspecified street and highway as the place of occurrence of the external cause: Secondary | ICD-10-CM | POA: Insufficient documentation

## 2019-06-24 MED ORDER — METHOCARBAMOL 500 MG PO TABS: 1000.0000 mg | ORAL_TABLET | Freq: Three times a day (TID) | ORAL | 0 refills | Status: AC
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
## 2020-01-14 ENCOUNTER — Ambulatory Visit: Payer: 59 | Attending: Internal Medicine

## 2020-01-14 DIAGNOSIS — Z20822 Contact with and (suspected) exposure to covid-19: Secondary | ICD-10-CM

## 2020-01-15 LAB — NOVEL CORONAVIRUS, NAA: SARS-CoV-2, NAA: NOT DETECTED

## 2020-01-15 LAB — SARS-COV-2, NAA 2 DAY TAT

## 2020-06-25 ENCOUNTER — Ambulatory Visit: Payer: 59 | Admitting: Family Medicine

## 2020-06-25 ENCOUNTER — Encounter: Payer: Self-pay | Admitting: Family Medicine

## 2020-06-25 ENCOUNTER — Other Ambulatory Visit: Payer: Self-pay

## 2020-06-25 VITALS — BP 114/60 | HR 72 | Ht 66.0 in | Wt 176.4 lb

## 2020-06-25 DIAGNOSIS — Z30011 Encounter for initial prescription of contraceptive pills: Secondary | ICD-10-CM

## 2020-06-25 DIAGNOSIS — E663 Overweight: Secondary | ICD-10-CM | POA: Diagnosis not present

## 2020-06-25 DIAGNOSIS — Z3009 Encounter for other general counseling and advice on contraception: Secondary | ICD-10-CM

## 2020-06-25 DIAGNOSIS — F4321 Adjustment disorder with depressed mood: Secondary | ICD-10-CM

## 2020-06-25 DIAGNOSIS — Z1159 Encounter for screening for other viral diseases: Secondary | ICD-10-CM | POA: Diagnosis not present

## 2020-06-25 DIAGNOSIS — Z309 Encounter for contraceptive management, unspecified: Secondary | ICD-10-CM | POA: Insufficient documentation

## 2020-06-25 MED ORDER — NORGESTIMATE-ETH ESTRADIOL 0.25-35 MG-MCG PO TABS: 1.0000 | ORAL_TABLET | Freq: Every day | ORAL | 11 refills | Status: DC

## 2020-06-25 NOTE — Patient Instructions (Addendum)
Grief: I do not think it will be necessary to start a medication at this time.  Please see the list of therapist in the community below.  Please reach out to establish care to help process through your grief.  Birth control: I highly recommend you stay on some sort of birth control if you are not planning to get pregnant.  We will get a pregnancy test today.  I have sent in birth control to your pharmacy.  Please do not start taking the birth control until we have the results of your pregnancy test today.  Cervical cancer screening: Please come back in the next 1-2 months for Pap smear.   Therapy and Counseling Resources Most providers on this list will take Medicaid. Patients with commercial insurance or Medicare should contact their insurance company to get a list of in network providers.  [facility]  [address], [address] [place], [place] [address]      [phone]  [facility] [address]  [place], [place] [address] [phone]  [facility] [address]  [place], [place] [address]       [phone]      Jovita Kussmaul [facility] [address], [place], [place] [phone]  [facility]:  [address] [place] [phone]  [facility]:  [address] [place]ien[place] [phone]  [facility] (under & uninsured) [address]   [place] [phone]    [email or web address]    [facility] [address] . [place]    [phone]  [facility] -[address]     Phone:  [phone]     [facility]-  [address]  [phone]   [facility] [address]      [place], [place] [phone]  [facility]: [address] [place], [place], [place]  [phone]   [facility] (Spanish therapist) [address] [place]   [address]   [place] [address]    [phone]    [facility]   [address],  [place], [place]  [phone]   [facility]  [address] [place]  [phone]  [facility]  [address] [place], [place]        [phone]  Open Access/Walk In Clinic under & uninsured  [facility]  [address] [place], [place] [phone] Crisis [phone]  [facility] [place],  (Spanish)   [address], [place]: [phone]) 8:30 - 12; 1 - 2:30  [facility] [employer],  [address] [place], [place]    [phone]):8:30 - 12; 2 - 3PM  [facility] [employer],  [address]  [place]; [phone]):   Mon - Fri 8 AM - 5 PM  [facility] [address] [place]  MWF 12:30 to 3:00 or call to schedule an appointment  [phone]  Specific Provider options Psychology Today  [email or web address] 1. click on find a therapist  2. enter your zip code 3. left side and select or tailor a therapist for your specific need.   [facility] Provider Directory [email or web address]  (Medicaid)   Follow all drop down to find a provider  Social Support program [facility] [place] [phone] or www.mhag.org  [address], [place], [place] [facility] support and educational   24- Hour Availability:  .  Marland Kitchen [facility]  . [address] [place], [place] [employer] [phone] Crisis [phone]  . [facility] [phone]  [facility] Crisis Service  [phone]   . [facility] [employer]  [phone] (after hours)  . [facility]rapeutic Alternative/Mobile Crisis   [phone]  . [place] National Suicide Hotline  [phone] (TALK)  . Call 911 or go to emergency room  . [employer]  [phone]);   [place] [facility] [employer]   . [facility]  [phone]); [place], [place], [place], [place], [place], [place], [place]

## 2020-06-25 NOTE — Assessment & Plan Note (Signed)
Due for a Pap smear. -Follow-up in 1-2 months for Pap smear

## 2020-06-25 NOTE — Assessment & Plan Note (Signed)
She reports that she has been having ongoing symptoms of grief for roughly the past 7 years.  Her PHQ-9 today indicates a total score of 4 and a score of 0 for question 9.  We discussed her mild degree of symptoms and she was encouraged to establish care with a therapist.  Based on the minor degree of symptoms, she has been encouraged to avoid medications at this time.  She is agreeable with establishing care with a therapist. -List of therapist in the community was provided

## 2020-06-25 NOTE — Progress Notes (Signed)
    SUBJECTIVE:   CHIEF COMPLAINT / HPI:   Ms. Vannatter presented to clinic today to establish care.  She now has health insurance which covers annual physical exams and wants to take advantage of the opportunity.  Her previous medical history was filled out through the history tab.  In addition to her medical history we discussed the following issues:  Grief She reports a significant difficulty with depressive symptoms since the death of her sister in 2013/02/21.  She has never been on medication before for depression although she has experienced 2 significant bouts of postpartum depression following her pregnancies.  She is interested in additional help today.  Birth Control She is currently sexually active with female partners.  She has no desire to be pregnant at this time.  She currently has 5 children.  She is not currently using any form of birth control.  She has no interest in any form of implanted birth control.  She specifically has requested oral contraceptives.  She has no history of blood clots.  She does not smoke.   PERTINENT  PMH / PSH: BMI of 28.  OBJECTIVE:   BP 114/60   Pulse 72   Ht 5\' 6"  (1.676 m)   Wt 176 lb 6 oz (80 kg)   LMP 06/08/2020   SpO2 99%   BMI 28.47 kg/m    General: Alert and cooperative and appears to be in no acute distress HEENT: Neck non-tender without lymphadenopathy, masses or thyromegaly Cardio: Normal S1 and S2, no S3 or S4. Rhythm is regular. No murmurs or rubs.   Pulm: Clear to auscultation bilaterally, no crackles, wheezing, or diminished breath sounds. Normal respiratory effort Abdomen: Bowel sounds normal. Abdomen soft and non-tender.  Extremities: No peripheral edema. Warm/ well perfused.  Strong radial pulse. 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 -Follow-up in 1-2 months for Pap smear  Grief She reports that she has been having  ongoing symptoms of grief for roughly the past 7 years.  Her PHQ-9 today indicates a total score of 4 and a score of 0 for question 9.  We discussed her mild degree of symptoms and she was encouraged to establish care with a therapist.  Based on the minor degree of symptoms, she has been encouraged to avoid medications at this time.  She is agreeable with establishing care with a therapist. -List of therapist in the community was provided  Encounter for birth control She has been sexually active in the past week.  Her last period began on 8/22. -Follow-up serum beta hCG -If negative, start Sprintec   Health maintenance -Follow-up for Pap smear -Follow-up hepatitis C  9/22, MD [facility]

## 2020-06-25 NOTE — Assessment & Plan Note (Signed)
She has been sexually active in the past week.  Her last period began on 8/22. -Follow-up serum beta hCG -If negative, start Sprintec

## 2020-06-26 LAB — HCG, SERUM, QUALITATIVE: hCG,Beta Subunit,Qual,Serum: NEGATIVE m[IU]/mL (ref <6)

## 2020-06-26 LAB — HEPATITIS C ANTIBODY: Hep C Virus Ab: <0.1 s/co ratio (ref 0.0–0.9)

## 2020-07-07 ENCOUNTER — Other Ambulatory Visit: Payer: 59

## 2020-07-07 ENCOUNTER — Other Ambulatory Visit: Payer: Self-pay

## 2020-07-07 DIAGNOSIS — Z20822 Contact with and (suspected) exposure to covid-19: Secondary | ICD-10-CM

## 2020-07-08 LAB — NOVEL CORONAVIRUS, NAA: SARS-CoV-2, NAA: NOT DETECTED

## 2020-07-08 LAB — SARS-COV-2, NAA 2 DAY TAT

## 2020-08-08 ENCOUNTER — Other Ambulatory Visit: Payer: Self-pay

## 2020-08-08 ENCOUNTER — Encounter (HOSPITAL_COMMUNITY): Payer: Self-pay

## 2020-08-08 ENCOUNTER — Ambulatory Visit (HOSPITAL_COMMUNITY)
Admission: EM | Admit: 2020-08-08 | Discharge: 2020-08-08 | Disposition: A | Discharge status: Home / Self Care | Payer: 59 | Attending: Emergency Medicine | Admitting: Emergency Medicine

## 2020-08-08 DIAGNOSIS — R350 Frequency of micturition: Secondary | ICD-10-CM

## 2020-08-08 DIAGNOSIS — N390 Urinary tract infection, site not specified: Secondary | ICD-10-CM | POA: Diagnosis present

## 2020-08-08 LAB — POCT URINALYSIS DIPSTICK, ED / UC
Bilirubin Urine: NEGATIVE
Glucose, UA: NEGATIVE mg/dL
Ketones, ur: NEGATIVE mg/dL
Nitrite: POSITIVE — AB
Protein, ur: 30 mg/dL — AB
Specific Gravity, Urine: >=1.03 (ref 1.005–1.030)
Urobilinogen, UA: 0.2 mg/dL (ref 0.0–1.0)
pH: 5.5 (ref 5.0–8.0)

## 2020-08-08 LAB — POC URINE PREG, ED: Preg Test, Ur: NEGATIVE

## 2020-08-08 MED ORDER — SULFAMETHOXAZOLE-TRIMETHOPRIM 800-160 MG PO TABS: 1.0000 | ORAL_TABLET | Freq: Two times a day (BID) | ORAL | 0 refills | Status: AC

## 2020-08-08 NOTE — ED Provider Notes (Signed)
[facility]    CSN: [id number] Arrival date & time: 08/08/20  7106      History   Chief Complaint Chief Complaint  Patient presents with  . Urinary Frequency    HPI Susan [name] is a 34 y.o. female.   Here today with 2 day history of urinary frequency, dysuria, vaginal discharge and irritation. Denies abdominal pain, N/V, flank pain, hematuria, fever, chills, known exposure to STIs. Has not been trying anything OTC for sxs. Has a hx of UTIs.      Past Medical History:  Diagnosis Date  . Chlamydia   . Pelvic inflammatory disease (PID)   . Post partum depression    Hx of    Patient Active Problem List   Diagnosis Date Noted  . Overweight 06/25/2020  . Grief 06/25/2020  . Encounter for birth control 06/25/2020  . 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 Rubella non-immune status 02/21/2013    Past Surgical History:  Procedure Laterality Date  . INDUCED ABORTION      OB History    Gravida  7   Para  5   Term  4   Preterm  1   AB  2   Living  5     SAB  0   TAB  2   Ectopic  0   Multiple  0   Live Births  5            Home Medications    Prior to Admission medications   Medication Sig Start Date End Date Taking? Authorizing Provider  norgestimate-ethinyl estradiol (SPRINTEC 28) 0.25-35 MG-MCG tablet Take 1 tablet by mouth daily. 06/25/20   Mirian Mo, MD  sulfamethoxazole-trimethoprim (BACTRIM DS) 800-160 MG tablet Take 1 tablet by mouth 2 (two) times daily for 3 days. 08/08/20 08/11/20  Particia Nearing, PA-C    Family History Family History  Problem Relation Age of Onset  . Hypertension Mother   . Hyperlipidemia Mother   . Mental illness Mother   . Heart disease Mother   . Lupus Sister   . Sickle cell trait Sister   . Hypertension Maternal Grandmother   . Heart disease Maternal Grandmother   . Lupus Maternal Grandfather   . Sickle cell trait Brother   . Diabetes  Maternal Aunt   . Hypertension Maternal Aunt     Social History Social History   Tobacco Use  . Smoking status: Never Smoker  . Smokeless tobacco: Never Used  Vaping Use  . Vaping Use: Never used  Substance Use Topics  . Alcohol use: No  . Drug use: No     Allergies   Patient has no known allergies.   Review of Systems Review of Systems PER HPI    Physical Exam Triage Vital Signs ED Triage Vitals  Enc Vitals Group     BP 08/08/20 1028 135/85     Pulse Rate 08/08/20 1028 (!) 56     Resp 08/08/20 1028 16     Temp 08/08/20 1028 98.9 F (37.2 C)     Temp Source 08/08/20 1028 Oral     SpO2 08/08/20 1028 98 %     Weight --      Height --      Head Circumference --      Peak Flow --      Pain Score 08/08/20 1030 0     Pain Loc --      Pain Edu? --  Excl. in GC? --    No data found.  Updated Vital Signs BP 135/85 (BP Location: Right Arm)   Pulse (!) 56   Temp 98.9 F (37.2 C) (Oral)   Resp 16   LMP 07/08/2020   SpO2 98%   Visual Acuity Right Eye Distance:   Left Eye Distance:   Bilateral Distance:    Right Eye Near:   Left Eye Near:    Bilateral Near:     Physical Exam Vitals and nursing note reviewed.  Constitutional:      Appearance: Normal appearance. She is not ill-appearing.  HENT:     Head: Atraumatic.  Eyes:     Extraocular Movements: Extraocular movements intact.     Conjunctiva/sclera: Conjunctivae normal.  Cardiovascular:     Rate and Rhythm: Normal rate and regular rhythm.     Heart sounds: Normal heart sounds.  Pulmonary:     Effort: Pulmonary effort is normal.     Breath sounds: Normal breath sounds.  Abdominal:     General: Bowel sounds are normal. There is no distension.     Palpations: Abdomen is soft.     Tenderness: There is no abdominal tenderness. There is no right CVA tenderness, left CVA tenderness or guarding.  Genitourinary:    Comments: GU exam deferred, self swab performed Musculoskeletal:        General:  Normal range of motion.     Cervical back: Normal range of motion and neck supple.  Skin:    General: Skin is warm and dry.  Neurological:     Mental Status: She is alert and oriented to person, place, and time.  Psychiatric:        Mood and Affect: Mood normal.        Thought Content: Thought content normal.        Judgment: Judgment normal.     UC Treatments / Results  Labs (all labs ordered are listed, but only abnormal results are displayed) Labs Reviewed  POCT URINALYSIS DIPSTICK, ED / UC - Abnormal; Notable for the following components:      Result Value   Hgb urine dipstick SMALL (*)    Protein, ur 30 (*)    Nitrite POSITIVE (*)    Leukocytes,Ua MODERATE (*)    All other components within normal limits  URINE CULTURE  POC URINE PREG, ED  CERVICOVAGINAL ANCILLARY ONLY    EKG   Radiology No results found.  Procedures Procedures (including critical care time)  Medications Ordered in UC Medications - No data to display  Initial Impression / Assessment and Plan / UC Course  I have reviewed the triage vital signs and the nursing notes.  Pertinent labs & imaging results that were available during my care of the patient were reviewed by me and considered in my medical decision making (see chart for details).     U/A positive for UTI, aptima swab and urine culture pending. Will tx with bactrim, probiotics, push fluids. Return precautions reviewed.   Final Clinical Impressions(s) / UC Diagnoses   Final diagnoses:  Lower urinary tract infectious disease   Discharge Instructions   None    ED Prescriptions    Medication Sig Dispense Auth. Provider   sulfamethoxazole-trimethoprim (BACTRIM DS) 800-160 MG tablet Take 1 tablet by mouth 2 (two) times daily for 3 days. 6 tablet Particia Nearing, [place]     PDMP not reviewed this encounter.   Particia Nearing, [place] 08/08/20 1116

## 2020-08-08 NOTE — ED Triage Notes (Signed)
Pt complaining of urinary frequency with vaginal pressure. Symptoms started two days ago. Pt would also like to be screened for STD

## 2020-08-10 LAB — URINE CULTURE: Culture: >=100000 — AB

## 2020-08-11 ENCOUNTER — Telehealth (HOSPITAL_COMMUNITY): Payer: Self-pay | Admitting: Emergency Medicine

## 2020-08-11 LAB — CERVICOVAGINAL ANCILLARY ONLY
Bacterial Vaginitis (gardnerella): POSITIVE — AB
Candida Glabrata: NEGATIVE
Candida Vaginitis: NEGATIVE
Chlamydia: NEGATIVE
Comment: NEGATIVE
Comment: NEGATIVE
Comment: NEGATIVE
Comment: NEGATIVE
Comment: NEGATIVE
Comment: NORMAL
Neisseria Gonorrhea: NEGATIVE
Trichomonas: NEGATIVE

## 2020-08-11 MED ORDER — NITROFURANTOIN MONOHYD MACRO 100 MG PO CAPS: 100.0000 mg | ORAL_CAPSULE | Freq: Two times a day (BID) | ORAL | 0 refills | Status: DC

## 2020-08-12 ENCOUNTER — Telehealth (HOSPITAL_COMMUNITY): Payer: Self-pay | Admitting: Emergency Medicine

## 2020-08-12 MED ORDER — METRONIDAZOLE 500 MG PO TABS: 500.0000 mg | ORAL_TABLET | Freq: Two times a day (BID) | ORAL | 0 refills | Status: DC

## 2020-08-15 ENCOUNTER — Ambulatory Visit: Payer: 59 | Admitting: Family Medicine

## 2020-08-22 ENCOUNTER — Encounter: Payer: Self-pay | Admitting: Family Medicine

## 2020-08-22 ENCOUNTER — Other Ambulatory Visit: Payer: Self-pay

## 2020-08-22 ENCOUNTER — Ambulatory Visit: Payer: 59 | Admitting: Family Medicine

## 2020-08-22 ENCOUNTER — Other Ambulatory Visit (HOSPITAL_COMMUNITY)
Admission: RE | Admit: 2020-08-22 | Discharge: 2020-08-22 | Disposition: A | Discharge status: Home / Self Care | Payer: 59 | Source: Ambulatory Visit | Attending: Family Medicine | Admitting: Family Medicine

## 2020-08-22 VITALS — BP 108/70 | HR 92 | Ht 66.0 in | Wt 175.1 lb

## 2020-08-22 DIAGNOSIS — Z30011 Encounter for initial prescription of contraceptive pills: Secondary | ICD-10-CM

## 2020-08-22 DIAGNOSIS — Z124 Encounter for screening for malignant neoplasm of cervix: Secondary | ICD-10-CM | POA: Diagnosis not present

## 2020-08-22 DIAGNOSIS — F4321 Adjustment disorder with depressed mood: Secondary | ICD-10-CM | POA: Diagnosis not present

## 2020-08-22 MED ORDER — NORETHINDRONE ACET-ETHINYL EST 1-20 MG-MCG PO TABS: 1.0000 | ORAL_TABLET | Freq: Every day | ORAL | 11 refills | Status: DC

## 2020-08-22 NOTE — Assessment & Plan Note (Signed)
She reports nausea with birth control and has been taking it consistently.  We will switch to birth control with a lower estrogen component. -Discontinue Sprintec -Start Loestrin

## 2020-08-22 NOTE — Patient Instructions (Signed)
Pap smear: It will take several days to get the results of this Pap smear.  If everything is normal, I will send you a message on my chart.  I will call you if there is anything to discuss.  Birth control: I'm sorry to hear that you are having some nausea with your birth control.  I have sent in a new birth control medicine with a lower estrogen component which I think will be helpful.  There are other birth control options we can consider if pills continue to be an issue.

## 2020-08-22 NOTE — Progress Notes (Addendum)
    SUBJECTIVE:   CHIEF COMPLAINT / HPI:   Screening for cervical cancer Susan [name] presents to clinic today for a Pap smear.  She reports that she is sexually active and not having any pelvic symptoms at this time.  She specifically denies vaginal discharge.  She is not interested in additional testing for STIs at this time.  Birth control She has been taking Sprintec daily consistently and reports that she has been experiencing mild nausea from this medication.  She believes she is taking different OCP with a lower estrogen dose in the past which was helpful.  Grief Since her last visit, she has not establish care with a therapist.  Her PHQ-9 had a score of 0 today.  She was not interested in any further discussion about grief today and reports that she is generally doing well.  PERTINENT  PMH / PSH: Grief, overweight  OBJECTIVE:   BP 108/70   Pulse 92   Ht 5\' 6"  (1.676 m)   Wt 175 lb 2 oz (79.4 kg)   SpO2 96%   BMI 28.27 kg/m    General: Resting comfortably in the exam table.  No acute distress. Respiratory: Breathing comfortably on room air.  No respiratory distress. Pelvic: Normal external female genitalia.  Moist, pink vaginal mucosa.  No discharge.  Os visualized and Pap smear performed.  ASSESSMENT/PLAN:   Encounter for birth control She reports nausea with birth control and has been taking it consistently.  We will switch to birth control with a lower estrogen component. -Discontinue Sprintec -Start Loestrin  Grief She was encouraged to establish care with a therapist or to return to clinic with worsening symptoms.   Health maintenance -Follow-up Pap smear   , MD [facility]

## 2020-08-22 NOTE — Assessment & Plan Note (Signed)
She was encouraged to establish care with a therapist or to return to clinic with worsening symptoms.

## 2020-08-26 LAB — CYTOLOGY - PAP
Comment: NEGATIVE
Diagnosis: UNDETERMINED — AB
High risk HPV: NEGATIVE

## 2021-05-22 ENCOUNTER — Encounter: Payer: Self-pay | Admitting: Family Medicine

## 2021-05-22 ENCOUNTER — Other Ambulatory Visit: Payer: Self-pay

## 2021-05-22 ENCOUNTER — Other Ambulatory Visit (HOSPITAL_COMMUNITY)
Admission: RE | Admit: 2021-05-22 | Discharge: 2021-05-22 | Disposition: A | Discharge status: Home / Self Care | Payer: 59 | Source: Ambulatory Visit | Attending: Family Medicine | Admitting: Family Medicine

## 2021-05-22 ENCOUNTER — Ambulatory Visit (INDEPENDENT_AMBULATORY_CARE_PROVIDER_SITE_OTHER): Payer: 59 | Admitting: Family Medicine

## 2021-05-22 VITALS — BP 109/81 | HR 65 | Ht 66.0 in | Wt 179.0 lb

## 2021-05-22 DIAGNOSIS — Z202 Contact with and (suspected) exposure to infections with a predominantly sexual mode of transmission: Secondary | ICD-10-CM | POA: Insufficient documentation

## 2021-05-22 DIAGNOSIS — Z113 Encounter for screening for infections with a predominantly sexual mode of transmission: Secondary | ICD-10-CM

## 2021-05-22 DIAGNOSIS — Z0000 Encounter for general adult medical examination without abnormal findings: Secondary | ICD-10-CM

## 2021-05-22 LAB — POCT WET PREP (WET MOUNT)
Clue Cells Wet Prep Whiff POC: NEGATIVE
Trichomonas Wet Prep HPF POC: ABSENT

## 2021-05-22 MED ORDER — NORETHINDRONE ACET-ETHINYL EST 1-20 MG-MCG PO TABS: 1.0000 | ORAL_TABLET | Freq: Every day | ORAL | 11 refills | Status: DC

## 2021-05-22 NOTE — Patient Instructions (Addendum)
Today we completed STD testing, we will call you with any results that are abnormal are also sending messages on MyChart.  If there is any infection thinks to be treated we will send the prescription as needed.  I have sent a new prescription for your birth control, if we need to send it to another pharmacy please let me know.  For your acne I recommend getting either the brand Cetaphil or CeraVe and getting a sensitive skin cleanser.  This will be important to do both in the morning and at night.  I also recommend getting from 1 of those brands a very light weight sensitive skin moisturizer that you can use overnight.  We will start with these products and then go to other ones as we need to to help clear up your skin.  I do recommend if you can to be on the sun at any point during the day using a light moisturizer that has SPF as well.

## 2021-05-22 NOTE — Progress Notes (Signed)
    SUBJECTIVE:   CHIEF COMPLAINT / HPI:   STD testing Last sexual intercourse 2 weeks ago with partner who has been with other partners. Only has one partner, does not use condoms and is not currently using anything for birth control. Was previously on Loestrin and would like to continue on it again.   Acne Patient has had acne for several years that has not resolved.  She does note that she does not use any cleansing materials or any treatment at this time.  Has used a cleanser in the past that broke her out more.  Healthcare maintenance Patient is up-to-date on current screening guidelines including Pap smear, next one will be due on 08/23/2023.  HIV screening is being completed today.  Patient declines flu vaccine and is currently not in the season to be administered.  PERTINENT  PMH / PSH: Reviewed  OBJECTIVE:   BP 109/81   Pulse 65   Ht 5\' 6"  (1.676 m)   Wt 179 lb (81.2 kg)   LMP 04/27/2021   SpO2 100%   BMI 28.89 kg/m   Gen: well-appearing, NAD CV: RRR, no m/r/g appreciated, no peripheral edema Pulm: CTAB, no wheezes/crackles GI: soft, non-tender, non-distended Pelvic exam: normal external genitalia, vulva, vagina, cervix, uterus and adnexa, WET MOUNT done - results: excessive bacteria.  ASSESSMENT/PLAN:   STD testing Per patient request completed STD testing with GC/CH, wet prep, HIV and RPR testing.  We will follow-up with patient regarding results.  Wet prep with bacteria but no other positive findings, no treatment required at this time.  Healthcare maintenance Patient is not due for Pap smear at this time, will be done in 2024  Acne vulgaris Recommended patient start with Cetaphil or CeraVe sensitive skin cleanser twice daily.  Also to get a sensitive skin moisturizer for overnight.  We will follow-up in 1 month.  May consider use of OTC retinol creams in the future if patient's skin is able to tolerate.  Advised to use moisturizing lotion with SPF during the day  especially around sunlight.  2025, DO [facility]

## 2021-05-23 LAB — HIV ANTIBODY (ROUTINE TESTING W REFLEX): HIV Screen 4th Generation wRfx: NONREACTIVE

## 2021-05-23 LAB — RPR: RPR Ser Ql: NONREACTIVE

## 2021-05-25 LAB — CERVICOVAGINAL ANCILLARY ONLY
Chlamydia: NEGATIVE
Comment: NEGATIVE
Comment: NEGATIVE
Comment: NORMAL
Neisseria Gonorrhea: NEGATIVE
Trichomonas: NEGATIVE

## 2021-11-24 ENCOUNTER — Encounter: Payer: Self-pay | Admitting: Emergency Medicine

## 2021-11-24 ENCOUNTER — Ambulatory Visit: Payer: Self-pay

## 2021-11-24 ENCOUNTER — Encounter: Payer: Self-pay | Admitting: Family Medicine

## 2021-11-24 ENCOUNTER — Other Ambulatory Visit: Payer: Self-pay

## 2021-11-24 ENCOUNTER — Ambulatory Visit
Admission: EM | Admit: 2021-11-24 | Discharge: 2021-11-24 | Disposition: A | Discharge status: Home / Self Care | Payer: Self-pay | Attending: Internal Medicine | Admitting: Internal Medicine

## 2021-11-24 DIAGNOSIS — J020 Streptococcal pharyngitis: Secondary | ICD-10-CM

## 2021-11-24 LAB — POCT RAPID STREP A (OFFICE): Rapid Strep A Screen: POSITIVE — AB

## 2021-11-24 MED ORDER — ACETAMINOPHEN 325 MG PO TABS
650.0000 mg | ORAL_TABLET | Freq: Once | ORAL | Status: AC
  Administered 2021-11-24: 650 mg via ORAL

## 2021-11-24 MED ORDER — AMOXICILLIN 500 MG PO CAPS: 500.0000 mg | ORAL_CAPSULE | Freq: Two times a day (BID) | ORAL | 0 refills | Status: AC

## 2021-11-24 NOTE — ED Provider Notes (Signed)
[name]    CSN: [id number] Arrival date & time: 11/24/21  0931      History   Chief Complaint Chief Complaint  Patient presents with   Sore Throat    HPI Susan [name] is a 36 y.o. female.   Patient presents with sore throat that has been present for approximately 3 days.  Patient reports it is very painful to swallow, and she has been spitting out her secretions due to this.  Patient is able to swallow but does not want to swallow due to pain.  Denies shortness of breath or difficulty breathing.  Denies any associated upper respiratory symptoms.  Denies any known fevers at home.  Denies any known sick contacts.   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 Authorizing Provider  amoxicillin (AMOXIL) 500 MG capsule Take 1 capsule (500 mg total) by mouth 2 (two) times daily for 10 days. 11/24/21 12/04/21 Yes , Acie Fredrickson, FNP  norethindrone-ethinyl estradiol (LOESTRIN 1/20, 21,) 1-20 MG-MCG tablet Take 1 tablet by mouth daily. 05/22/21   Evelena Leyden, DO    Family History Family History  Problem Relation Age of Onset   Hypertension Mother    Hyperlipidemia Mother    Mental illness Mother    Heart disease Mother    Lupus Sister    Sickle cell trait  Sister    Hypertension Maternal Grandmother    Heart disease Maternal Grandmother    Lupus Maternal Grandfather    Sickle cell trait Brother    Diabetes Maternal Aunt    Hypertension Maternal Aunt     Social History Social History   Tobacco Use   Smoking status: Never   Smokeless tobacco: Never  Vaping Use   Vaping Use: Never used  Substance Use Topics   Alcohol use: No   Drug use: No     Allergies   Patient has no known allergies.   Review of Systems Review of Systems Per HPI  Physical Exam Triage Vital Signs ED Triage Vitals  Enc Vitals Group     BP 11/24/21 1032 126/82     Pulse Rate 11/24/21 1032 99     Resp 11/24/21 1032 18     Temp 11/24/21 1032 (!) 100.7 F (38.2 C)     Temp Source 11/24/21 1032 Oral     SpO2 11/24/21 1032 97 %     Weight --      Height --  Head Circumference --      Peak Flow --      Pain Score 11/24/21 1033 8     Pain Loc --      Pain Edu? --      Excl. in GC? --    No data found.  Updated Vital Signs BP 126/82 (BP Location: Left Arm)    Pulse 99    Temp (!) 100.7 F (38.2 C) (Oral)    Resp 18    SpO2 97%   Visual Acuity Right Eye Distance:   Left Eye Distance:   Bilateral Distance:    Right Eye Near:   Left Eye Near:    Bilateral Near:     Physical Exam Constitutional:      General: She is not in acute distress.    Appearance: Normal appearance. She is not toxic-appearing or diaphoretic.  HENT:     Head: Normocephalic and atraumatic.     Right Ear: Tympanic membrane and ear canal normal.     Left Ear: Tympanic membrane and ear canal normal.     Nose: Nose normal.     Mouth/Throat:     Lips: Pink.     Mouth: Mucous membranes are moist.     Pharynx: Oropharyngeal exudate and posterior oropharyngeal erythema present. No pharyngeal swelling or uvula swelling.     Tonsils: Tonsillar exudate present. No tonsillar abscesses.  Eyes:     Extraocular Movements: Extraocular movements intact.     Conjunctiva/sclera:  Conjunctivae normal.  Cardiovascular:     Rate and Rhythm: Normal rate and regular rhythm.     Pulses: Normal pulses.     Heart sounds: Normal heart sounds.  Pulmonary:     Effort: Pulmonary effort is normal. No respiratory distress.     Breath sounds: Normal breath sounds.  Neurological:     General: No focal deficit present.     Mental Status: She is alert and oriented to person, place, and time. Mental status is at baseline.  Psychiatric:        Mood and Affect: Mood normal.        Behavior: Behavior normal.        Thought Content: Thought content normal.        Judgment: Judgment normal.     UC Treatments / Results  Labs (all labs ordered are listed, but only abnormal results are displayed) Labs Reviewed  POCT RAPID STREP A (OFFICE) - Abnormal; Notable for the following components:      Result Value   Rapid Strep A Screen Positive (*)    All other components within normal limits    EKG   Radiology No results found.  Procedures Procedures (including critical care time)  Medications Ordered in UC Medications  acetaminophen (TYLENOL) tablet 650 mg (650 mg Oral Given 11/24/21 1037)    Initial Impression / Assessment and Plan / UC Course  I have reviewed the triage vital signs and the nursing notes.  Pertinent labs & imaging results that were available during my care of the patient were reviewed by me and considered in my medical decision making (see chart for details).     Rapid strep test was positive.  Will treat with amoxicillin antibiotic.  No signs of peritonsillar abscess on exam.  Fever monitoring and management discussed with patient.  Discussed symptomatic treatment.  Discussed strict return and ER precautions.  Patient verbalized understanding and was agreeable with plan. Final Clinical Impressions(s) / UC Diagnoses   Final diagnoses:  Strep pharyngitis     Discharge Instructions      You have strep throat which is being treated with antibiotics.  May take ibuprofen or tylenol as needed for pain. Monitor fevers. Go to hospital if symptoms worsen.     ED Prescriptions     Medication Sig Dispense Auth. Provider   amoxicillin (AMOXIL) 500 MG capsule Take 1 capsule (500 mg total) by mouth 2 (two) times daily for 10 days. 20 capsule [name], [place]      PDMP not reviewed this encounter.   [name], [place] 11/24/21 1115

## 2021-11-24 NOTE — Discharge Instructions (Addendum)
You have strep throat which is being treated with antibiotics. May take ibuprofen or tylenol as needed for pain. Monitor fevers. Go to hospital if symptoms worsen.

## 2021-11-24 NOTE — ED Triage Notes (Signed)
Pt here for sore throat x 3 days; pt is spitting out secretions due to pain; pt sts can swallow but painful

## 2021-11-25 ENCOUNTER — Ambulatory Visit: Payer: Self-pay

## 2022-03-23 ENCOUNTER — Encounter: Payer: Self-pay | Admitting: *Deleted

## 2022-09-17 ENCOUNTER — Encounter (HOSPITAL_COMMUNITY): Payer: Self-pay | Admitting: *Deleted

## 2022-09-17 ENCOUNTER — Ambulatory Visit (HOSPITAL_COMMUNITY)
Admission: EM | Admit: 2022-09-17 | Discharge: 2022-09-17 | Disposition: A | Discharge status: Home / Self Care | Payer: Self-pay | Attending: Emergency Medicine | Admitting: Emergency Medicine

## 2022-09-17 DIAGNOSIS — N898 Other specified noninflammatory disorders of vagina: Secondary | ICD-10-CM | POA: Insufficient documentation

## 2022-09-17 MED ORDER — METRONIDAZOLE 500 MG PO TABS: 500.0000 mg | ORAL_TABLET | Freq: Two times a day (BID) | ORAL | 0 refills | Status: DC

## 2022-09-17 NOTE — ED Provider Notes (Signed)
[facility]    CSN: [id number] Arrival date & time: 09/17/22  1125      History   Chief Complaint Chief Complaint  Patient presents with   Vaginal Discharge    HPI Susan [name] is a 36 y.o. female. Patient reports yellow vaginal discharge that started 3 days ago.  Patient denies any pelvic pain, dysuria, abdominal pain, nausea, or vomiting.  Patient reports that she has not had any sexual intercourse in the past month.  Patient reports 1 consistent sexual partner.  Patient reports an abnormal odor to discharge.  Patient denies any known STD exposure.  Patient denies any vaginal itching.  Patient denies any changes to skin on the vulvar area. Patient has not taken any medications for symptoms.  Last menstrual period was on August 22, 2022 and patient is on oral contraceptive.    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 Authorizing Provider  metroNIDAZOLE (FLAGYL) 500 MG tablet Take 1 tablet (500 mg total) by mouth 2 (two) times daily. 09/17/22  Yes Debby Freiberg, NP  norethindrone-ethinyl estradiol (LOESTRIN 1/20, 21,) 1-20 MG-MCG tablet Take 1 tablet  by mouth daily. 05/22/21   Evelena Leyden, DO    Family History Family History  Problem Relation Age of Onset   Hypertension Mother    Hyperlipidemia Mother    Mental illness Mother    Heart disease Mother    Lupus Sister    Sickle cell trait Sister    Hypertension Maternal Grandmother    Heart disease Maternal Grandmother    Lupus Maternal Grandfather    Sickle cell trait Brother    Diabetes Maternal Aunt    Hypertension Maternal Aunt     Social History Social History   Tobacco Use   Smoking status: Never   Smokeless tobacco: Never  Vaping Use   Vaping Use: Never used  Substance Use Topics   Alcohol use: No   Drug use: No     Allergies   Patient has no known allergies.   Review of Systems Review of Systems  Constitutional: Negative.   Gastrointestinal:  Negative for abdominal pain, nausea and vomiting.  Genitourinary:  Positive for vaginal discharge. Negative for decreased urine volume, difficulty urinating, dyspareunia, dysuria, enuresis, flank pain, frequency, genital sores, hematuria, menstrual problem, pelvic pain, urgency, vaginal bleeding and vaginal pain.  Physical Exam Triage Vital Signs ED Triage Vitals  Enc Vitals Group     BP 09/17/22 1332 (!) 156/86     Pulse Rate 09/17/22 1332 67     Resp 09/17/22 1332 18     Temp 09/17/22 1332 98.4 F (36.9 C)     Temp Source 09/17/22 1332 Oral     SpO2 09/17/22 1332 98 %     Weight --      Height --      Head Circumference --      Peak Flow --      Pain Score 09/17/22 1330 0     Pain Loc --      Pain Edu? --      Excl. in GC? --    No data found.  Updated Vital Signs BP (!) 156/86 (BP Location: Right Arm)   Pulse 67   Temp 98.4 F (36.9 C) (Oral)   Resp 18   LMP 08/22/2022 (Approximate)   SpO2 98%       Physical Exam Vitals and nursing note reviewed.  Constitutional:      Appearance: Normal appearance.  Genitourinary:    Comments: Deferred exam Neurological:     Mental Status: She  is alert.      UC Treatments / Results  Labs (all labs ordered are listed, but only abnormal results are displayed) Labs Reviewed  CERVICOVAGINAL ANCILLARY ONLY    EKG   Radiology No results found.  Procedures Procedures (including critical care time)  Medications Ordered in UC Medications - No data to display  Initial Impression / Assessment and Plan / UC Course  I have reviewed the triage vital signs and the nursing notes.  Pertinent labs & imaging results that were available during my care of the patient were reviewed by me and considered in my medical decision making (see chart for details).     Patient was evaluated for vaginal discharge.  Vaginal swab is pending .  Patient is being empirically treated for bacterial vaginosis based on symptomology.  Prescription for Flagyl was sent to the pharmacy and patient was made aware of treatment regiment.  Patient was made aware of possible side effects of the medication.  Patient was made aware of results reporting protocol and MyChart. Patient verbalized understanding of instructions.  Charting was provided using a a verbal dictation system, charting was proofread for errors, errors may occur which could change the meaning of the information charted.    Final Clinical Impressions(s) / UC Diagnoses   Final diagnoses:  Vaginal discharge     Discharge Instructions      We will call you if any of your test results warrant a change in your plan of care.  You may view these test results on MyChart.   You are being treated for bacterial vaginosis today.  An antibiotic called Flagyl was sent to the pharmacy, you will take this medication twice a day for the next 7 days.  Please refrain from any alcohol use while taking this medication and 72 hours after finishing the antibiotic.      ED Prescriptions     Medication Sig Dispense Auth. Provider   metroNIDAZOLE (FLAGYL) 500 MG tablet Take 1 tablet (500 mg total) by mouth 2  (two) times daily. 14 tablet Debby Freiberg, NP      PDMP not reviewed this encounter.   Debby Freiberg, NP 09/17/22 1541

## 2022-09-17 NOTE — Discharge Instructions (Addendum)
We will call you if any of your test results warrant a change in your plan of care.  You may view these test results on MyChart.   You are being treated for bacterial vaginosis today.  An antibiotic called Flagyl was sent to the pharmacy, you will take this medication twice a day for the next 7 days.  Please refrain from any alcohol use while taking this medication and 72 hours after finishing the antibiotic.

## 2022-09-17 NOTE — ED Triage Notes (Signed)
Pt states she is having yellow vaginal discharge since Tuesday. No known exposures to STI.

## 2022-09-20 LAB — CERVICOVAGINAL ANCILLARY ONLY
Bacterial Vaginitis (gardnerella): POSITIVE — AB
Candida Glabrata: NEGATIVE
Candida Vaginitis: NEGATIVE
Chlamydia: NEGATIVE
Comment: NEGATIVE
Comment: NEGATIVE
Comment: NEGATIVE
Comment: NEGATIVE
Comment: NEGATIVE
Comment: NORMAL
Neisseria Gonorrhea: NEGATIVE
Trichomonas: POSITIVE — AB

## 2023-01-19 ENCOUNTER — Ambulatory Visit (INDEPENDENT_AMBULATORY_CARE_PROVIDER_SITE_OTHER): Payer: BC Managed Care – PPO | Admitting: Family Medicine

## 2023-01-19 ENCOUNTER — Other Ambulatory Visit: Payer: Self-pay

## 2023-01-19 VITALS — BP 124/92 | HR 80 | Ht 64.0 in | Wt 209.2 lb

## 2023-01-19 DIAGNOSIS — Z111 Encounter for screening for respiratory tuberculosis: Secondary | ICD-10-CM

## 2023-01-19 NOTE — Addendum Note (Signed)
Addended by: Holley Bouche T on: 01/19/2023 04:35 PM   Modules accepted: Orders

## 2023-01-19 NOTE — Patient Instructions (Signed)
It was great seeing you today!  We will get the TB test and I will let you know of the results, once this is complete I can complete the form you need for work. We will contact you when the form is ready for pick up.   Please follow up at your next scheduled appointment in 7 months for PAP smear, if anything arises between now and then, please don't hesitate to contact our office.   Thank you for allowing [place] to be a part of your medical care!  Thank you, Dr. Larae Grooms  Also a reminder of our clinic's no-show policy. Please make sure to arrive at least 15 minutes prior to your scheduled appointment time. Please try to cancel before 24 hours if you are not able to make it. If you no-show for 2 appointments then you will be receiving a warning letter. If you no-show after 3 visits, then you may be at risk of being dismissed from our clinic. This is to ensure that everyone is able to be seen in a timely manner. Thank you, we appreciate your assistance with this!

## 2023-01-19 NOTE — Progress Notes (Signed)
    SUBJECTIVE:   CHIEF COMPLAINT / HPI:   Patient presents for a physical, denies any concerns today. She needs TB testing for work, she is applying for a new job at the [employer] and they are requiring this. She will be driver's education. Scheduled to start when her paperwork is complete. LMP 01/13/2023, has regular cycles. She is not currently on birth control and remains sexually active. Does not always use protection. She took a pregnancy test 2 months ago and it was negative, her cycles remain the same without changes.   OBJECTIVE:   BP (!) 124/92   Pulse 80   Ht 5\' 4"  (1.626 m)   Wt 209 lb 3.2 oz (94.9 kg)   SpO2 100%   BMI 35.91 kg/m   General: Patient well-appearing, in no acute distress. HEENT: PERRLA, non-tender thyroid, no evidence of cervical LAD CV: RRR, no murmurs or gallops auscultated Resp: CTAB, no wheezing, rales or rhonchi noted, no evidence of focal findings noted, breathing comfortably on room air Abdomen: soft, nontender, nondistended, presence of bowel sounds Ext: no LE edema noted bilaterally Psych: mood appropriate, very pleasant   ASSESSMENT/PLAN:   Health maintenance -Quantiferon pending -Safe sex practices discussed, encouraged protection and starting birth control. Patient agrees and will consider. Encouraged condom use for now. -Up to date on PAP smear, next due Nov 2024 -PHQ-9 score of 5 with negative question 9 reviewed.  -Follow up in 7 months for repeat cervical cancer screening   Owain Eckerman Larae Grooms, [place]

## 2023-01-19 NOTE — Addendum Note (Signed)
Addended by: Holley Bouche T on: 01/19/2023 04:36 PM   Modules accepted: Orders

## 2023-01-27 ENCOUNTER — Encounter: Payer: Self-pay | Admitting: Family Medicine

## 2023-01-27 LAB — QUANTIFERON-TB GOLD PLUS
QuantiFERON Mitogen Value: >10 IU/mL
QuantiFERON Nil Value: 0.02 IU/mL
QuantiFERON TB1 Ag Value: 0.03 IU/mL
QuantiFERON TB2 Ag Value: 0.02 IU/mL
QuantiFERON-TB Gold Plus: NEGATIVE

## 2023-01-31 ENCOUNTER — Telehealth: Payer: Self-pay

## 2023-01-31 NOTE — Telephone Encounter (Signed)
Physical form was placed in RN box with no other documentation.   I attempted to call patient, however no answer or option for identifiable VM.   Copy made for batch scanning.

## 2023-02-06 ENCOUNTER — Encounter (HOSPITAL_COMMUNITY): Payer: Self-pay | Admitting: Emergency Medicine

## 2023-02-06 ENCOUNTER — Ambulatory Visit (HOSPITAL_COMMUNITY)
Admission: EM | Admit: 2023-02-06 | Discharge: 2023-02-06 | Disposition: A | Discharge status: Home / Self Care | Payer: BC Managed Care – PPO | Attending: Physician Assistant | Admitting: Physician Assistant

## 2023-02-06 ENCOUNTER — Other Ambulatory Visit: Payer: Self-pay

## 2023-02-06 DIAGNOSIS — N898 Other specified noninflammatory disorders of vagina: Secondary | ICD-10-CM | POA: Diagnosis present

## 2023-02-06 DIAGNOSIS — L243 Irritant contact dermatitis due to cosmetics: Secondary | ICD-10-CM

## 2023-02-06 DIAGNOSIS — B3731 Acute candidiasis of vulva and vagina: Secondary | ICD-10-CM | POA: Diagnosis present

## 2023-02-06 LAB — POCT URINALYSIS DIP (MANUAL ENTRY)
Bilirubin, UA: NEGATIVE
Glucose, UA: NEGATIVE mg/dL
Ketones, POC UA: NEGATIVE mg/dL
Nitrite, UA: NEGATIVE
Protein Ur, POC: NEGATIVE mg/dL
Spec Grav, UA: 1.025 (ref 1.010–1.025)
Urobilinogen, UA: 0.2 E.U./dL
pH, UA: 5.5 (ref 5.0–8.0)

## 2023-02-06 LAB — POCT URINE PREGNANCY: Preg Test, Ur: NEGATIVE

## 2023-02-06 MED ORDER — FLUCONAZOLE 150 MG PO TABS: 150.0000 mg | ORAL_TABLET | Freq: Every day | ORAL | 1 refills | Status: DC

## 2023-02-06 MED ORDER — HYDROCORTISONE 2.5 % EX CREA: TOPICAL_CREAM | Freq: Two times a day (BID) | CUTANEOUS | 0 refills | Status: DC

## 2023-02-06 NOTE — ED Triage Notes (Addendum)
Vaginal discharge x 2 days,.  Today, discharge increase and is yellow,  denies abdominal pain , denies back pain.   Thinks this may be related to a new detergent fragrance. Has not tried any medicines  Bilateral axilla pain a, redness, inflamed.  Reports changing deodorants.  This started 2 days ago.

## 2023-02-06 NOTE — Discharge Instructions (Signed)
Advised take the Diflucan 150 mg, 1 tablet today and then you may repeat in 3 to 5 days if symptoms fail to improve. Advised to use the hydrocortisone cream, apply to the area lightly 2-3 times a day over the next 2 to 3 days till the irritation clears.  Lab results will be completed in 48 hours.  If you do not get a call from this office that indicates the test are negative.  Log onto MyChart to be the test results when they post in 48 hours.

## 2023-02-06 NOTE — ED Notes (Signed)
This nurse at bedside for provider examination of bilateral axilla irritation

## 2023-02-06 NOTE — ED Provider Notes (Signed)
[facility]    CSN: [id number] Arrival date & time: 02/06/23  1041      History   Chief Complaint Chief Complaint  Patient presents with   Vaginal Discharge    HPI Susan [name] is a 37 y.o. female.   37 year old female presents with vaginal discharge and armpit irritation.  Patient indicates for the past 2 days she has been having vaginal discharge which she describes as being thick, white, clumpy.  She indicates that this morning it changed color to yellow.  She indicates there is no external or internal itching.  She also indicates that there is no odor to the discharge.  Patient indicates her last period was March 25 and was regular.  She does indicate that she recently changed her detergent and believes that the detergent she is using is more irritated to the genital area.  She indicates she is not having any urinary problems such as frequency, urgency, or dysuria.  She is without fever or chills. Patient also indicates that she started using "Lumi" underarm deodorant.  She indicates that 2 days after using the deodorant she started having under arm axilla irritation, soreness, redness and itching.  She has since discontinued using this product.   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 Authorizing Provider  fluconazole (DIFLUCAN) 150 MG tablet Take 1 tablet (150 mg total) by mouth daily. May repeat dose in 3-5 days if needed. 02/06/23  Yes Ellsworth Lennox, PA-C  hydrocortisone 2.5 % cream Apply topically 2 (two) times daily. 02/06/23  Yes Ellsworth Lennox, PA-C  metroNIDAZOLE (FLAGYL) 500 MG tablet Take 1 tablet (500 mg total) by mouth 2 (two) times daily. Patient not taking: Reported on 01/19/2023 09/17/22   Debby Freiberg, NP  norethindrone-ethinyl estradiol (LOESTRIN 1/20, 21,) 1-20 MG-MCG tablet Take 1 tablet by mouth daily. Patient not taking: Reported on 01/19/2023 05/22/21   Evelena Leyden, DO    Family History Family History  Problem Relation Age of Onset   Hypertension Mother    Hyperlipidemia Mother    Mental illness Mother    Heart disease Mother    Lupus Sister    Sickle cell trait Sister    Hypertension Maternal Grandmother    Heart disease Maternal Grandmother    Lupus Maternal Grandfather    Sickle cell trait Brother    Diabetes Maternal Aunt    Hypertension Maternal Aunt     Social History Social History  Tobacco Use   Smoking status: Never   Smokeless tobacco: Never  Vaping Use   Vaping Use: Never used  Substance Use Topics   Alcohol use: No   Drug use: No     Allergies   Patient has no known allergies.   Review of Systems Review of Systems  Genitourinary:  Positive for vaginal discharge (thick, yellow, white without odor).     Physical Exam Triage Vital Signs ED Triage Vitals  Enc Vitals Group     BP 02/06/23 1134 113/78     Pulse Rate 02/06/23 1134 69     Resp 02/06/23 1134 18     Temp 02/06/23 1134 98.7 F (37.1 C)     Temp Source 02/06/23 1134 Oral     SpO2 02/06/23 1134 98 %     Weight --      Height --      Head Circumference --      Peak Flow --      Pain Score 02/06/23 1131 5     Pain Loc --      Pain Edu? --      Excl. in GC? --    No data found.  Updated Vital  Signs BP 113/78 (BP Location: Left Arm) Comment (BP Location): large cuff  Pulse 69   Temp 98.7 F (37.1 C) (Oral)   Resp 18   LMP 01/13/2023   SpO2 98%   Visual Acuity Right Eye Distance:   Left Eye Distance:   Bilateral Distance:    Right Eye Near:   Left Eye Near:    Bilateral Near:     Physical Exam Constitutional:      Appearance: Normal appearance.  Abdominal:     General: Abdomen is flat. Bowel sounds are normal.     Palpations: Abdomen is soft.     Tenderness: There is no abdominal tenderness.  Skin:    Comments: Axilla: Bilateral axillae have areas of redness, irritation, without excoriation due to the contact irritant.  These areas of redness are localized to where the contact.  Irritant was placed.  There is no streaking and no drainage at the sites.  Neurological:     Mental Status: She is alert.      UC Treatments / Results  Labs (all labs ordered are listed, but only abnormal results are displayed) Labs Reviewed  POCT URINALYSIS DIP (MANUAL ENTRY) - Abnormal; Notable for the following components:      Result Value   Blood, UA trace-lysed (*)    Leukocytes, UA Large (3+) (*)    All other components within normal limits  POCT URINE PREGNANCY  CERVICOVAGINAL ANCILLARY ONLY    EKG   Radiology No results found.  Procedures Procedures (including critical care time)  Medications Ordered in UC Medications - No data to display  Initial Impression / Assessment and Plan / UC Course  I have reviewed the triage vital signs and the nursing notes.  Pertinent labs & imaging results that were available during my care of the patient were reviewed by me and considered in my medical decision making (see chart for details).    Plan: The diagnosis to be treated with the following: 1.  Vaginal discharge: A.  STI screening lab results are pending. 2.  Yeast vaginal infection: A.  Diflucan 150 mg, 1 tablet today and then may repeat in 3 to 5 days. 3.   Contact irritant dermatitis axilla: A.  Hydrocortisone 2.5% apply to the area lightly twice  a day until the irritation resolves. 4.  Patient advised follow-up PCP return to urgent care as needed. Final Clinical Impressions(s) / UC Diagnoses   Final diagnoses:  Vaginal discharge  Vaginal yeast infection  Irritant contact dermatitis due to cosmetics     Discharge Instructions      Advised take the Diflucan 150 mg, 1 tablet today and then you may repeat in 3 to 5 days if symptoms fail to improve. Advised to use the hydrocortisone cream, apply to the area lightly 2-3 times a day over the next 2 to 3 days till the irritation clears.  Lab results will be completed in 48 hours.  If you do not get a call from this office that indicates the test are negative.  Log onto MyChart to be the test results when they post in 48 hours.    ED Prescriptions     Medication Sig Dispense Auth. Provider   fluconazole (DIFLUCAN) 150 MG tablet Take 1 tablet (150 mg total) by mouth daily. May repeat dose in 3-5 days if needed. 2 tablet Ellsworth Lennox, PA-C   hydrocortisone 2.5 % cream Apply topically 2 (two) times daily. 30 g Ellsworth Lennox, PA-C      PDMP not reviewed this encounter.   Ellsworth Lennox, PA-C 02/06/23 1225

## 2023-02-07 LAB — CERVICOVAGINAL ANCILLARY ONLY
Bacterial Vaginitis (gardnerella): POSITIVE — AB
Candida Glabrata: NEGATIVE
Candida Vaginitis: NEGATIVE
Chlamydia: NEGATIVE
Comment: NEGATIVE
Comment: NEGATIVE
Comment: NEGATIVE
Comment: NEGATIVE
Comment: NEGATIVE
Comment: NORMAL
Neisseria Gonorrhea: NEGATIVE
Trichomonas: POSITIVE — AB

## 2023-02-08 ENCOUNTER — Telehealth (HOSPITAL_COMMUNITY): Payer: Self-pay | Admitting: *Deleted

## 2023-02-08 ENCOUNTER — Telehealth: Payer: Self-pay

## 2023-02-08 MED ORDER — METRONIDAZOLE 500 MG PO TABS: 500.0000 mg | ORAL_TABLET | Freq: Two times a day (BID) | ORAL | 0 refills | Status: AC

## 2023-02-08 NOTE — Telephone Encounter (Signed)
Pt called about cyto  Positive for BV and yeast looks like treated for yeast at [facility].   Pharmacy [facility]

## 2023-02-08 NOTE — Telephone Encounter (Signed)
Patient calls nurse line requesting treatment from urgent care visit.   She reports she viewed the results on mychart, however contacted UC and they stated they would not be able to treat her until tomorrow.   Patient is asking we send in the medication.   Advised we would prefer the UC continue with treatment, however will forward to PCP.

## 2023-02-08 NOTE — Telephone Encounter (Signed)
Flagyl sent for BV on swab

## 2023-02-09 NOTE — Telephone Encounter (Signed)
Treatment has already been sent by Urgent Care. No further action needed.   Maury Dus, MD PGY-3, [facility]

## 2024-08-13 ENCOUNTER — Ambulatory Visit (HOSPITAL_COMMUNITY)
Admission: EM | Admit: 2024-08-13 | Discharge: 2024-08-13 | Disposition: A | Discharge status: Home / Self Care | Payer: Self-pay | Attending: Physician Assistant | Admitting: Physician Assistant

## 2024-08-13 ENCOUNTER — Ambulatory Visit (INDEPENDENT_AMBULATORY_CARE_PROVIDER_SITE_OTHER): Payer: Self-pay

## 2024-08-13 ENCOUNTER — Ambulatory Visit (HOSPITAL_COMMUNITY): Payer: Self-pay | Admitting: Physician Assistant

## 2024-08-13 ENCOUNTER — Encounter (HOSPITAL_COMMUNITY): Payer: Self-pay | Admitting: *Deleted

## 2024-08-13 DIAGNOSIS — M791 Myalgia, unspecified site: Secondary | ICD-10-CM

## 2024-08-13 DIAGNOSIS — M7918 Myalgia, other site: Secondary | ICD-10-CM

## 2024-08-13 DIAGNOSIS — S161XXA Strain of muscle, fascia and tendon at neck level, initial encounter: Secondary | ICD-10-CM

## 2024-08-13 MED ORDER — METHOCARBAMOL 500 MG PO TABS: 500.0000 mg | ORAL_TABLET | Freq: Two times a day (BID) | ORAL | 0 refills | Status: AC

## 2024-08-13 MED ORDER — IBUPROFEN 800 MG PO TABS
800.0000 mg | ORAL_TABLET | Freq: Once | ORAL | Status: AC
  Administered 2024-08-13: 800 mg via ORAL

## 2024-08-13 MED ORDER — IBUPROFEN 800 MG PO TABS
ORAL_TABLET | ORAL | Status: AC
  Filled 2024-08-13: qty 1
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
## 2024-08-13 NOTE — Progress Notes (Signed)
 Xray has been interpreted by Radiology and was negative for evidence of fracture or alignment concerns. Proceed with management plan as discussed.
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
## 2024-08-17 ENCOUNTER — Encounter: Payer: Self-pay | Admitting: Family Medicine

## 2024-08-17 ENCOUNTER — Ambulatory Visit: Admitting: Family Medicine

## 2024-08-17 ENCOUNTER — Ambulatory Visit
Admission: RE | Admit: 2024-08-17 | Discharge: 2024-08-17 | Disposition: A | Discharge status: Home / Self Care | Source: Ambulatory Visit | Attending: Family Medicine | Admitting: Family Medicine

## 2024-08-17 VITALS — BP 117/73 | HR 61 | Ht 66.0 in | Wt 218.4 lb

## 2024-08-17 DIAGNOSIS — M545 Low back pain, unspecified: Secondary | ICD-10-CM

## 2024-08-17 MED ORDER — NAPROXEN 500 MG PO TABS: 500.0000 mg | ORAL_TABLET | Freq: Two times a day (BID) | ORAL | 0 refills | Status: AC
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
## 2024-08-18 LAB — BASIC METABOLIC PANEL WITH GFR
BUN/Creatinine Ratio: 13 (ref 9–23)
BUN: 9 mg/dL (ref 6–20)
CO2: 22 mmol/L (ref 20–29)
Calcium: 9.6 mg/dL (ref 8.7–10.2)
Chloride: 104 mmol/L (ref 96–106)
Creatinine, Ser: 0.68 mg/dL (ref 0.57–1.00)
Glucose: 71 mg/dL (ref 70–99)
Potassium: 4.3 mmol/L (ref 3.5–5.2)
Sodium: 139 mmol/L (ref 134–144)
eGFR: 116 mL/min/{1.73_m2}

## 2024-08-20 ENCOUNTER — Ambulatory Visit: Payer: Self-pay | Admitting: Family Medicine
